# Patient Record
Sex: Female | Born: 1955
Health system: Southern US, Community
[De-identification: ages and names within clinical notes are randomized; demographics above are authoritative.]

## PROBLEM LIST (undated history)

## (undated) DIAGNOSIS — N189 Chronic kidney disease, unspecified: Secondary | ICD-10-CM

## (undated) DIAGNOSIS — R55 Syncope and collapse: Principal | ICD-10-CM

## (undated) DIAGNOSIS — E669 Obesity, unspecified: Secondary | ICD-10-CM

## (undated) DIAGNOSIS — M1990 Unspecified osteoarthritis, unspecified site: Secondary | ICD-10-CM

## (undated) DIAGNOSIS — I10 Essential (primary) hypertension: Secondary | ICD-10-CM

## (undated) HISTORY — PX: CATARACT EXTRACTION: SUR2

## (undated) HISTORY — PX: OTHER SURGICAL HISTORY: SHX169

## (undated) HISTORY — DX: Chronic kidney disease, unspecified: N18.9

## (undated) HISTORY — DX: Syncope and collapse: R55

## (undated) HISTORY — DX: Obesity, unspecified: E66.9

## (undated) HISTORY — PX: BACK SURGERY: SHX140

---

## 1995-12-17 HISTORY — PX: TUBAL LIGATION: SHX77

## 1995-12-17 HISTORY — PX: EXPLORATORY LAPAROTOMY: SUR591

## 2002-12-16 HISTORY — PX: BREAST BIOPSY: SHX20

## 2002-12-16 HISTORY — PX: SEPTOPLASTY: SHX2393

## 2003-03-01 ENCOUNTER — Emergency Department (HOSPITAL_COMMUNITY): Admission: EM | Admit: 2003-03-01 | Discharge: 2003-03-02 | Payer: Self-pay | Admitting: Emergency Medicine

## 2003-03-02 ENCOUNTER — Encounter: Payer: Self-pay | Admitting: Emergency Medicine

## 2003-03-30 ENCOUNTER — Ambulatory Visit (HOSPITAL_COMMUNITY): Admission: RE | Admit: 2003-03-30 | Discharge: 2003-03-30 | Payer: Self-pay | Admitting: Otolaryngology

## 2006-12-31 ENCOUNTER — Ambulatory Visit (HOSPITAL_COMMUNITY): Admission: RE | Admit: 2006-12-31 | Discharge: 2006-12-31 | Payer: Self-pay | Admitting: Family Medicine

## 2007-07-08 ENCOUNTER — Ambulatory Visit (HOSPITAL_COMMUNITY): Admission: RE | Admit: 2007-07-08 | Discharge: 2007-07-08 | Payer: Self-pay | Admitting: Obstetrics and Gynecology

## 2008-01-20 ENCOUNTER — Ambulatory Visit (HOSPITAL_COMMUNITY): Admission: RE | Admit: 2008-01-20 | Discharge: 2008-01-20 | Payer: Self-pay | Admitting: Family Medicine

## 2009-02-01 ENCOUNTER — Ambulatory Visit (HOSPITAL_COMMUNITY): Admission: RE | Admit: 2009-02-01 | Discharge: 2009-02-01 | Payer: Self-pay | Admitting: Family Medicine

## 2010-03-19 ENCOUNTER — Ambulatory Visit (HOSPITAL_COMMUNITY): Admission: RE | Admit: 2010-03-19 | Discharge: 2010-03-19 | Payer: Self-pay | Admitting: Family Medicine

## 2011-01-06 ENCOUNTER — Encounter: Payer: Self-pay | Admitting: Family Medicine

## 2011-05-03 ENCOUNTER — Other Ambulatory Visit (HOSPITAL_COMMUNITY): Payer: Self-pay | Admitting: Family Medicine

## 2011-05-03 DIAGNOSIS — Z139 Encounter for screening, unspecified: Secondary | ICD-10-CM

## 2011-05-03 NOTE — Discharge Summary (Signed)
Cindy Mcknight, Cindy Mcknight                              ACCOUNT NO.:  [id number]   MEDICAL RECORD NO.:  [id number]                   PATIENT TYPE:  INP   LOCATION:  1823                                 FACILITY:  [facility]   PHYSICIAN:  Sherin Quarry, MD                   DATE OF BIRTH:  1956-07-09   DATE OF ADMISSION:  03/01/2003  DATE OF DISCHARGE:  03/02/2003                                 DISCHARGE SUMMARY   HISTORY OF PRESENT ILLNESS:  The patient is a 55 year old lady whose only  chronic medication is Wellbutrin 300 mg daily.  She has a previous history  of syncope, most recently about five years ago.  The patient indicates that  she was having dinner with some friends at [facility] when she went  to the bathroom.  In the bathroom she urinated and then felt somewhat dizzy.  She stood up and applied a cold compress to her forehead, and then the next  thing she remembers is that she felt as though she was awakening and noticed  that she had some bleeding from her nose.  On reflection, she concluded that  she must have passed out, hit her nose, and caused a laceration.  She  estimates that she must have been unconsciousness for two or three minutes.  There was no associated headache, visual blurring, breathing difficulty, or  chest pain.   PHYSICAL EXAMINATION:  GENERAL:  Physical exam at the time of admission is  described by Dr. Leander Rams.  VITAL SIGNS:  Her blood pressure is 114/65, pulse 94, respirations 20,  temperature was 97.6.  HEENT:  Within normal limits.  CHEST:  Clear.  CARDIOVASCULAR:  Revealed a grade 2-3/6 systolic murmur which could be heard  both in the aortic and the mitral area.  ABDOMEN:  Benign.  NEUROLOGIC:  Normal.  EXTREMITIES:  Normal.   LABORATORY DATA:  EKG showed no ischemic changes.   A CT scan of the brain as well as a CT scan of the spine was negative.  The  patient's x-rays showed a deviation of the nasal septum.   HOSPITAL COURSE:  The laceration  of the nose was sewn up.  The patient was  observed overnight.  Telemetry showed no abnormalities.  CBC and BMET were  within normal limits.  By March 02, 2003, at 10:30, the patient felt well.  She had no dizziness.  She had no headache, no breathing difficulty, or  chest pain.  At that time it was felt reasonable to discharge the patient.  The assumption was that the patient had experienced an episode of vasovagal  syncope.   DISCHARGE DIAGNOSES:  1. Syncope secondary to vasovagal reaction.  2. Nasal septum fracture.  3. Laceration of the forehead.  4. Depression.   DISCHARGE INSTRUCTIONS:  The patient was advised to continue Wellbutrin 300  mg daily.  I  also advised her that in light of her heart murmur, it was  important for her to have an echocardiogram.  I offered to keep her in the  hospital until an echocardiogram could be obtained but, understandably, she  did not want to do that.  I offered to  schedule an echocardiogram at [facility], but she would prefer that  this be done by her primary doctor, Dr. Neita Carp, at [facility].  Therefore, I will suggest this in the discharge summary.  The patient will  follow up with Dr. Neita Carp in 10-14 days.                                               Sherin Quarry, MD    SY/MEDQ  D:  03/02/2003  T:  03/03/2003  Job:  [id number]   cc:   Fara Chute  [address] [place]  [place] [address]  Fax: [phone]

## 2011-05-03 NOTE — Op Note (Signed)
Cindy Mcknight, Cindy Mcknight                              ACCOUNT NO.:  [id number]   MEDICAL RECORD NO.:  [id number]                   PATIENT TYPE:  OIB   LOCATION:  NA                                   FACILITY:  [facility]   PHYSICIAN:  Kinnie Scales. Annalee Genta, M.D.            DATE OF BIRTH:  1956/09/09   DATE OF PROCEDURE:  03/30/2003  DATE OF DISCHARGE:                                 OPERATIVE REPORT   PREOPERATIVE DIAGNOSES:  1. Open nasal fracture.  2. Traumatic nasal septal deviation.  3. History of facial trauma.   POSTOPERATIVE DIAGNOSES:  1. Open nasal fracture.  2. Traumatic nasal septal deviation.  3. History of facial trauma.   INDICATION FOR PROCEDURE:  1. Open nasal fracture.  2. Traumatic nasal septal deviation.  3. History of facial trauma.   PROCEDURES:  1. Nasal septal reconstruction.  2. Closed reduction, nasal fracture.  3. Inferior turbinate cautery.   ANESTHESIA:  General endotracheal.   SURGEON:  Kinnie Scales. Annalee Genta, M.D.   COMPLICATIONS:  None.   ESTIMATED BLOOD LOSS:  Approximately 50 mL..   DISPOSITION:  Patient transferred from the operating room to the recovery  room in stable condition.   BRIEF HISTORY:  The patient is a 55 year old white female with a history of  depression and borderline hypoglycemia, who fell in late March with an acute  syncopal episode.  She struck her face and had a significant open nasal  fracture with severely comminuted nasal septal fracture.  She was seen in  the emergency department and her external nasal laceration was closed.  CT  scanning was obtained.  Workup included head CT, which showed no evidence of  intracranial abnormality.  The patient was seen as an outpatient on 03/08/03.  She was found to have a severely deviated nasal septum and open nasal  fracture.  I recommended that we undertake open reduction of nasal fracture  and nasal septal reconstruction.  The patient was scheduled for initial  surgery as an  outpatient at the [facility] day surgical center.  At the time  of her surgical procedure the patient was stable, but upon transport to the  operating room on 03/24/03 the patient had a second syncopal episode.  She was  referred to Jonelle Sidle, M.D., of [facility] Cardiology service for  evaluation and workup, and it was felt that this represented neurogenic or  vasovagal syncope.  There was no evidence of cardiac abnormality or  hypoglycemia, and the patient was cleared for general anesthesia for her  surgical procedure.  Informed consent was obtained from the patient and her  family prior to surgical intervention for open reduction of nasal fracture,  nasal septal reconstruction, and turbinate cautery.  The risks, benefits,  and possible complications of each of these surgical procedures were  discussed in detail with the patient and her family.  They understood and  concurred  with our plan for surgery, which was scheduled at [facility] main OR with possible overnight observation.   DESCRIPTION OF PROCEDURE:  The patient was brought into the operating room  on 03/30/03, placed in the supine position on the operating table.  General  endotracheal anesthesia was established without difficulty.  With the  patient adequately anesthetized, she was injected with 12 mL of 1% lidocaine  and 1:100,000 dilute epinephrine injected in a submucosal fashion along the  nasal septum and inferior turbinates as well as a regional block along the  external nasal bones.  The patient's nose was then packed with Afrin-soaked  cottonoid pledgets that were left in place for approximately 10 minutes to  allow for vasoconstriction and hemostasis.  The patient was prepped and  draped in a sterile fashion and pledgets were removed.  Intranasal  examination showed a severely deviated nasal septum with septal spurring,  findings consistent with acute nasal septal trauma.  There was no evidence  of septal  perforation or septal hematoma.  The surgical procedure was begun  by creating a right hemitransfixion incision.  It was carried through the  mucosa and the underlying submucosa and a mucoperiosteal flap was elevated  from anterior to posterior along the patient's right nasal septum.  There  was a severely deviated nasal septum with an acute septal cartilage fracture  consistent with her traumatic history.  The patient had a large septal spur  occluding the left nasal cavity.  The bony-cartilaginous junction was  crossed in the midline.  A mucoperichondrial flap was elevated along the  patient's left-hand side and deviated bone and cartilage from the mid- and  posterior aspects of the nasal septum were resected.  Anterior dorsal and  columellar cartilage was intact.  Fractured cartilage was removed.  Cartilage was morcellized and returned to the mucoperichondrial pocket at  the completion of the surgical procedure.  The mucoperichondrial flaps were  reapproximated with a 4-0 gut suture on a Keith needle in a horizontal  mattressing fashion.  The hemitransfixion incision was closed with the same  stitch.  At the conclusion of the procedure bilateral Doyle nasal septal  splints were placed after the application of Bactroban ointment and sutured  with a 3-0 Ethilon suture.   Bilateral inferior turbinate intramural cautery was then performed.  With  the cautery set at 12 watts, two passes were made in submucosal fashion in  each inferior turbinate and when the turbinates were adequately cauterized,  they were outfractured to create a more patent nasal cavity.   Attention was then turned to the patient's nasal dorsum.  There was  significant widening of the nasal dorsum with depression of the nasal bones  bilaterally.  A closed reduction of nasal fracture was then undertaken using  a Goldman Psychologist, counselling.  The patient's nasal bones were elevated, brought into a good midline position with  excellent anterior nasal projection.  This  appeared to be stable, and there was no significant nasal dorsal deviation.  The patient did have an improvement in nasal dorsal widening, continued to  have an external nasal laceration.  An external rhinoplasty splint was then  applied after application of Benzoin solution, quarter-inch Steri-Strips  were placed over the nasal dorsum, and an Aquaplast nasal splint was then  applied and conformed to the nasal profile, and half-inch paper tap was  applied.  The patient was then awakened from her anesthetic, the oral cavity  and oropharynx were irrigated and suctioned.  Orogastric tube was passed,  stomach contents were aspirated.  The patient was then awakened, extubated,  and transferred from the operating room to the recovery room in stable  condition.   COMPLICATIONS:  None.   ESTIMATED BLOOD LOSS:  Approximately 50 mL.                                              Kinnie Scales. Annalee Genta, M.D.   DLS/MEDQ  D:  45/40/9811  T:  03/30/2003  Job:  [id number]

## 2011-05-07 ENCOUNTER — Ambulatory Visit (HOSPITAL_COMMUNITY)
Admission: RE | Admit: 2011-05-07 | Discharge: 2011-05-07 | Disposition: A | Discharge status: Home / Self Care | Payer: BC Managed Care – PPO | Source: Ambulatory Visit | Attending: Family Medicine | Admitting: Family Medicine

## 2011-05-07 DIAGNOSIS — Z139 Encounter for screening, unspecified: Secondary | ICD-10-CM

## 2011-05-07 DIAGNOSIS — Z1231 Encounter for screening mammogram for malignant neoplasm of breast: Secondary | ICD-10-CM | POA: Insufficient documentation

## 2012-05-15 ENCOUNTER — Telehealth: Payer: Self-pay

## 2012-05-15 ENCOUNTER — Other Ambulatory Visit: Payer: Self-pay

## 2012-05-15 DIAGNOSIS — Z139 Encounter for screening, unspecified: Secondary | ICD-10-CM

## 2012-05-15 NOTE — Telephone Encounter (Signed)
Gastroenterology Pre-Procedure Form   Pt said she had a flex sig many years ago by Dr. Jena Gauss  Pt is diabetic/ I did not have an early appt for this week and this was the only week she would do it  Request Date: 05/15/2012     Requesting Physician: Dr. Neita Carp     PATIENT INFORMATION:  Cindy Mcknight is a 56 y.o., female (DOB=1956-07-21).  PROCEDURE: Procedure(s) requested: colonoscopy Procedure Reason: screening for colon cancer  PATIENT REVIEW QUESTIONS: The patient reports the following:   1. Diabetes Melitis: yes  2. Joint replacements in the past 12 months: no 3. Major health problems in the past 3 months: no 4. Has an artificial valve or MVP:no 5. Has been advised in past to take antibiotics in advance of a procedure like teeth cleaning: no}    MEDICATIONS & ALLERGIES:    Patient reports the following regarding taking any blood thinners:   Plavix? no Aspirin?yes  Coumadin?  no  Patient confirms/reports the following medications:  Current Outpatient Prescriptions  Medication Sig Dispense Refill  . aspirin 81 MG tablet Take 81 mg by mouth daily.      Marland Kitchen lisinopril (PRINIVIL,ZESTRIL) 20 MG tablet Take 20 mg by mouth daily.      . metFORMIN (GLUCOPHAGE) 500 MG tablet Take 500 mg by mouth once. Pt takes four pills daily in the evening      . naproxen sodium (ANAPROX) 220 MG tablet Take 220 mg by mouth 2 (two) times daily with a meal. Only as needed      . NON FORMULARY Osteo Biflex   Takes two tabs daily        Patient confirms/reports the following allergies:  No Known Allergies  Patient is appropriate to schedule for requested procedure(s): yes  AUTHORIZATION INFORMATION Primary Insurance:   ID #:   Group #:  Pre-Cert / Auth required:  Pre-Cert / Auth #:   Secondary Insurance:  ID #:   Group #:  Pre-Cert / Auth required:  Pre-Cert / Auth #:   No orders of the defined types were placed in this encounter.    SCHEDULE INFORMATION: Procedure has been scheduled as  follows:  Date: 06/08/2012         Time: 10:45 AM  Location: [facility] Short Stay  This Gastroenterology Pre-Precedure Form is being routed to the following provider(s) for review: R. Roetta Sessions, MD

## 2012-05-18 NOTE — Telephone Encounter (Signed)
Day of prep, hold metformin. If sugar high, can take 1/2 dose. May take sip of OJ in AM of procedure if sugar low.

## 2012-05-19 NOTE — Telephone Encounter (Signed)
Rx and instructions mailed to pt.  

## 2012-06-01 ENCOUNTER — Encounter (HOSPITAL_COMMUNITY): Payer: Self-pay | Admitting: Pharmacy Technician

## 2012-06-02 ENCOUNTER — Telehealth: Payer: Self-pay

## 2012-06-02 NOTE — Telephone Encounter (Signed)
Pt wanted to know if she could do a different prep.Please advise and call her at [phone].

## 2012-06-02 NOTE — Telephone Encounter (Signed)
Can give Moviprep.

## 2012-06-02 NOTE — Telephone Encounter (Signed)
Routing to Leslie Lewis, PA.  

## 2012-06-03 NOTE — Telephone Encounter (Signed)
Per Tana Coast, PA , OK to give the Miralax prep if pt has not had any issues with constipation. Pt said she has not had any at all. Instructions in mail. If pt does not receive, she will come by before we close on Fri noon to pick up copy here.

## 2012-06-05 MED ORDER — SODIUM CHLORIDE 0.45 % IV SOLN
Freq: Once | INTRAVENOUS | Status: AC
  Administered 2012-06-08: 11:00:00 via INTRAVENOUS

## 2012-06-08 ENCOUNTER — Encounter (HOSPITAL_COMMUNITY)
Admission: RE | Disposition: A | Discharge status: Home Health | Payer: Self-pay | Source: Ambulatory Visit | Attending: Internal Medicine

## 2012-06-08 ENCOUNTER — Encounter (HOSPITAL_COMMUNITY): Payer: Self-pay

## 2012-06-08 ENCOUNTER — Ambulatory Visit (HOSPITAL_COMMUNITY)
Admission: RE | Admit: 2012-06-08 | Discharge: 2012-06-08 | Disposition: A | Discharge status: Home Health | Payer: BC Managed Care – PPO | Source: Ambulatory Visit | Attending: Internal Medicine | Admitting: Internal Medicine

## 2012-06-08 DIAGNOSIS — Z1211 Encounter for screening for malignant neoplasm of colon: Secondary | ICD-10-CM

## 2012-06-08 DIAGNOSIS — E119 Type 2 diabetes mellitus without complications: Secondary | ICD-10-CM | POA: Insufficient documentation

## 2012-06-08 DIAGNOSIS — Z139 Encounter for screening, unspecified: Secondary | ICD-10-CM

## 2012-06-08 HISTORY — PX: COLONOSCOPY: SHX5424

## 2012-06-08 HISTORY — DX: Syncope and collapse: R55

## 2012-06-08 SURGERY — COLONOSCOPY
Anesthesia: Moderate Sedation

## 2012-06-08 MED ORDER — MIDAZOLAM HCL 5 MG/5ML IJ SOLN
INTRAMUSCULAR | Status: AC
  Filled 2012-06-08: qty 10

## 2012-06-08 MED ORDER — MEPERIDINE HCL 100 MG/ML IJ SOLN
INTRAMUSCULAR | Status: DC | PRN
  Administered 2012-06-08 (×2): 50 mg via INTRAVENOUS

## 2012-06-08 MED ORDER — STERILE WATER FOR IRRIGATION IR SOLN
Status: DC | PRN
  Administered 2012-06-08: 13:00:00

## 2012-06-08 MED ORDER — MEPERIDINE HCL 100 MG/ML IJ SOLN
INTRAMUSCULAR | Status: AC
  Filled 2012-06-08: qty 2

## 2012-06-08 MED ORDER — MIDAZOLAM HCL 5 MG/5ML IJ SOLN
INTRAMUSCULAR | Status: DC | PRN
  Administered 2012-06-08: 1 mg via INTRAVENOUS
  Administered 2012-06-08 (×2): 2 mg via INTRAVENOUS
  Administered 2012-06-08: 1 mg via INTRAVENOUS

## 2012-06-08 NOTE — Discharge Instructions (Signed)
Colonoscopy Care After Read the instructions outlined below and refer to this sheet in the next few weeks. These discharge instructions provide you with general information on caring for yourself after you leave the hospital. Your doctor may also give you specific instructions. While your treatment has been planned according to the most current medical practices available, unavoidable complications occasionally occur. If you have any problems or questions after discharge, call your doctor. HOME CARE INSTRUCTIONS ACTIVITY:  You may resume your regular activity, but move at a slower pace for the next 24 hours.   Take frequent rest periods for the next 24 hours.   Walking will help get rid of the air and reduce the bloated feeling in your belly (abdomen).   No driving for 24 hours (because of the medicine (anesthesia) used during the test).   You may shower.   Do not sign any important legal documents or operate any machinery for 24 hours (because of the anesthesia used during the test).  NUTRITION:  Drink plenty of fluids.   You may resume your normal diet as instructed by your doctor.   Begin with a light meal and progress to your normal diet. Heavy or fried foods are harder to digest and may make you feel sick to your stomach (nauseated).   Avoid alcoholic beverages for 24 hours or as instructed.  MEDICATIONS:  You may resume your normal medications unless your doctor tells you otherwise.  WHAT TO EXPECT TODAY:  Some feelings of bloating in the abdomen.   Passage of more gas than usual.   Spotting of blood in your stool or on the toilet paper.  IF YOU HAD POLYPS REMOVED DURING THE COLONOSCOPY:  No aspirin products for 7 days or as instructed.   No alcohol for 7 days or as instructed.   Eat a soft diet for the next 24 hours.  FINDING OUT THE RESULTS OF YOUR TEST Not all test results are available during your visit. If your test results are not back during the visit, make an  appointment with your caregiver to find out the results. Do not assume everything is normal if you have not heard from your caregiver or the medical facility. It is important for you to follow up on all of your test results.  SEEK IMMEDIATE MEDICAL CARE IF:  You have more than a spotting of blood in your stool.   Your belly is swollen (abdominal distention).   You are nauseated or vomiting.   You have a fever.   You have abdominal pain or discomfort that is severe or gets worse throughout the day.  Document Released: 07/16/2004 Document Revised: 11/21/2011 Document Reviewed: 07/14/2008 ExitCare Patient Information 2012 [employer]. 

## 2012-06-08 NOTE — Op Note (Signed)
[facility] [address] [place], [place]  [address]  COLONOSCOPY PROCEDURE REPORT  PATIENT:  Cindy, Mcknight  MR#:  [id number] BIRTHDATE:  1956-06-05, 56 yrs. old  GENDER:  female ENDOSCOPIST:  R. Roetta Sessions, MD FACP [facility] REF. BY:  Fara Chute, M.D.  Dr Forrest Moron PROCEDURE DATE:  06/08/2012 PROCEDURE:  Screening colonoscopy  INDICATIONS:  First-ever average risk screening colonoscopy  INFORMED CONSENT:  The risks, benefits, alternatives and imponderables including but not limited to bleeding, perforation as well as the possibility of a missed lesion have been reviewed. The potential for biopsy, lesion removal, etc. have also been discussed.  Questions have been answered.  All parties agreeable. Please see the history and physical in the medical record for more information.  MEDICATIONS:  Versed 6 mg IV and Demerol 100 mg IV in divided doses.  DESCRIPTION OF PROCEDURE:  After a digital rectal exam was performed, the EC-[id number] ([id number]) colonoscope was advanced from the anus through the rectum and colon to the area of the cecum, ileocecal valve and appendiceal orifice.  The cecum was deeply intubated.  These structures were well-seen and photographed for the record.  From the level of the cecum and ileocecal valve, the scope was slowly and cautiously withdrawn.  The mucosal surfaces were carefully surveyed utilizing scope tip deflection to facilitate fold flattening as needed.  The scope was pulled down into the rectum where a thorough examination including retroflexion was performed. <<PROCEDUREIMAGES>>  FINDINGS: Adequate preparation. Anal papilla; otherwise normal rectum. Normal appearing colonic mucosa.  THERAPEUTIC / DIAGNOSTIC MANEUVERS PERFORMED: None  COMPLICATIONS:  None  CECAL WITHDRAWAL TIME: 11 minutes  IMPRESSION:  Normal colonoscopy  RECOMMENDATIONS: Repeat screening colonoscopy in 10 years  ______________________________ R. Roetta Sessions, MD Caleen Essex  CC:  Fara Chute, M.D.  n. eSIGNED:   R. Roetta Sessions at 06/08/2012 01:03 PM  Cherrie Gauze, [phone]

## 2012-06-08 NOTE — H&P (Signed)
  Primary Care Physician:  Estanislado Pandy, MD Primary Gastroenterologist:  Dr. Jena Gauss  Pre-Procedure History & Physical: HPI:  ZAMIRA HICKAM is a 56 y.o. female is here for a screening colonoscopy. Flexible sigmoidoscopy reportedly back in 1997. No bowel symptoms currently. No family history colon polyps or colon cancer. No prior colonoscopy  Past Medical History  Diagnosis Date  . Diabetes mellitus   . Fainting     on occasion when she see needles    Past Surgical History  Procedure Date  . Cesect   . Septoplasty 2004  . Cesarean section 1988  . Exploratory laparotomy 1997    Prior to Admission medications   Medication Sig Start Date End Date Taking? Authorizing Provider  aspirin EC 81 MG tablet Take 81 mg by mouth daily.   Yes Historical Provider, MD  Glucosamine-Chondroit-Vit C-Mn (GLUCOSAMINE 1500 COMPLEX PO) Take 1 tablet by mouth 2 (two) times daily.   Yes Historical Provider, MD  lisinopril (PRINIVIL,ZESTRIL) 20 MG tablet Take 20 mg by mouth daily.   Yes Historical Provider, MD  metFORMIN (GLUCOPHAGE-XR) 500 MG 24 hr tablet Take 2,000 mg by mouth daily with breakfast.   Yes Historical Provider, MD    Allergies as of 05/15/2012  . (No Known Allergies)    History reviewed. No pertinent family history.  History   Social History  . Marital Status: Divorced    Spouse Name: N/A    Number of Children: N/A  . Years of Education: N/A   Occupational History  . Not on file.   Social History Main Topics  . Smoking status: Never Smoker   . Smokeless tobacco: Not on file  . Alcohol Use: No  . Drug Use: No  . Sexually Active:    Other Topics Concern  . Not on file   Social History Narrative  . No narrative on file    Review of Systems: See HPI, otherwise negative ROS  Physical Exam: BP 117/71  Pulse 82  Temp 98.1 F (36.7 C) (Oral)  Resp 22  Ht 5\' 7"  (1.702 m)  Wt 270 lb (122.471 kg)  BMI 42.29 kg/m2  SpO2 98% General:   Alert,  Well-developed,  well-nourished, pleasant and cooperative in NAD Head:  Normocephalic and atraumatic. Eyes:  Sclera clear, no icterus.   Conjunctiva pink. Ears:  Normal auditory acuity. Nose:  No deformity, discharge,  or lesions. Mouth:  No deformity or lesions, dentition normal. Neck:  Supple; no masses or thyromegaly. Lungs:  Clear throughout to auscultation.   No wheezes, crackles, or rhonchi. No acute distress. Heart:  Regular rate and rhythm; no murmurs, clicks, rubs,  or gallops. Abdomen:  Soft, nontender and nondistended. No masses, hepatosplenomegaly or hernias noted. Normal bowel sounds, without guarding, and without rebound.   Msk:  Symmetrical without gross deformities. Normal posture. Pulses:  Normal pulses noted. Extremities:  Without clubbing or edema. Neurologic:  Alert and  oriented x4;  grossly normal neurologically. Skin:  Intact without significant lesions or rashes. Cervical Nodes:  No significant cervical adenopathy. Psych:  Alert and cooperative. Normal mood and affect.  Impression/Plan: HILMA STEINHILBER is now here to undergo a screening colonoscopy.  First-ever average risk screening colonoscopy.  Risks, benefits, limitations, imponderables and alternatives regarding colonoscopy have been reviewed with the patient. Questions have been answered. All parties agreeable.

## 2012-06-10 ENCOUNTER — Encounter (HOSPITAL_COMMUNITY): Payer: Self-pay | Admitting: Internal Medicine

## 2012-09-18 ENCOUNTER — Other Ambulatory Visit (HOSPITAL_COMMUNITY): Payer: Self-pay | Admitting: Family Medicine

## 2012-09-18 DIAGNOSIS — Z139 Encounter for screening, unspecified: Secondary | ICD-10-CM

## 2012-09-22 ENCOUNTER — Ambulatory Visit (HOSPITAL_COMMUNITY)
Admission: RE | Admit: 2012-09-22 | Discharge: 2012-09-22 | Disposition: A | Discharge status: Home / Self Care | Payer: BC Managed Care – PPO | Source: Ambulatory Visit | Attending: Family Medicine | Admitting: Family Medicine

## 2012-09-22 DIAGNOSIS — Z139 Encounter for screening, unspecified: Secondary | ICD-10-CM

## 2012-09-22 DIAGNOSIS — Z1231 Encounter for screening mammogram for malignant neoplasm of breast: Secondary | ICD-10-CM | POA: Insufficient documentation

## 2013-10-04 ENCOUNTER — Other Ambulatory Visit (HOSPITAL_COMMUNITY): Payer: Self-pay | Admitting: Obstetrics and Gynecology

## 2013-10-04 DIAGNOSIS — Z139 Encounter for screening, unspecified: Secondary | ICD-10-CM

## 2013-10-05 ENCOUNTER — Ambulatory Visit (HOSPITAL_COMMUNITY)
Admission: RE | Admit: 2013-10-05 | Discharge: 2013-10-05 | Disposition: A | Discharge status: Home / Self Care | Payer: BC Managed Care – PPO | Source: Ambulatory Visit | Attending: Obstetrics and Gynecology | Admitting: Obstetrics and Gynecology

## 2013-10-05 DIAGNOSIS — Z1231 Encounter for screening mammogram for malignant neoplasm of breast: Secondary | ICD-10-CM | POA: Insufficient documentation

## 2013-10-05 DIAGNOSIS — Z139 Encounter for screening, unspecified: Secondary | ICD-10-CM

## 2013-10-07 ENCOUNTER — Ambulatory Visit (HOSPITAL_COMMUNITY): Payer: BC Managed Care – PPO

## 2014-10-07 ENCOUNTER — Other Ambulatory Visit (HOSPITAL_COMMUNITY): Payer: Self-pay | Admitting: Family Medicine

## 2014-10-07 DIAGNOSIS — Z1231 Encounter for screening mammogram for malignant neoplasm of breast: Secondary | ICD-10-CM

## 2014-10-13 ENCOUNTER — Ambulatory Visit (HOSPITAL_COMMUNITY)
Admission: RE | Admit: 2014-10-13 | Discharge: 2014-10-13 | Disposition: A | Discharge status: Home / Self Care | Payer: BC Managed Care – PPO | Source: Ambulatory Visit | Attending: Family Medicine | Admitting: Family Medicine

## 2014-10-13 DIAGNOSIS — Z1231 Encounter for screening mammogram for malignant neoplasm of breast: Secondary | ICD-10-CM | POA: Insufficient documentation

## 2015-08-11 ENCOUNTER — Ambulatory Visit: Payer: BC Managed Care – PPO | Admitting: Nutrition

## 2015-12-22 ENCOUNTER — Encounter: Payer: Self-pay | Admitting: Neurology

## 2015-12-22 ENCOUNTER — Ambulatory Visit (INDEPENDENT_AMBULATORY_CARE_PROVIDER_SITE_OTHER): Payer: BC Managed Care – PPO | Admitting: Neurology

## 2015-12-22 VITALS — BP 132/79 | HR 93 | Ht 67.0 in | Wt 240.0 lb

## 2015-12-22 DIAGNOSIS — R55 Syncope and collapse: Secondary | ICD-10-CM | POA: Diagnosis not present

## 2015-12-22 HISTORY — DX: Syncope and collapse: R55

## 2015-12-22 NOTE — Patient Instructions (Addendum)
We will set you up for testing to include MRI of the brain and EEG evaluation. We will get a 30 day cardiac monitor.  Syncope Syncope is a medical term for fainting or passing out. This means you lose consciousness and drop to the ground. People are generally unconscious for less than 5 minutes. You may have some muscle twitches for up to 15 seconds before waking up and returning to normal. Syncope occurs more often in older adults, but it can happen to anyone. While most causes of syncope are not dangerous, syncope can be a sign of a serious medical problem. It is important to seek medical care.  CAUSES  Syncope is caused by a sudden drop in blood flow to the brain. The specific cause is often not determined. Factors that can bring on syncope include:  Taking medicines that lower blood pressure.  Sudden changes in posture, such as standing up quickly.  Taking more medicine than prescribed.  Standing in one place for too long.  Seizure disorders.  Dehydration and excessive exposure to heat.  Low blood sugar (hypoglycemia).  Straining to have a bowel movement.  Heart disease, irregular heartbeat, or other circulatory problems.  Fear, emotional distress, seeing blood, or severe pain. SYMPTOMS  Right before fainting, you may:  Feel dizzy or light-headed.  Feel nauseous.  See all white or all black in your field of vision.  Have cold, clammy skin. DIAGNOSIS  Your health care provider will ask about your symptoms, perform a physical exam, and perform an electrocardiogram (ECG) to record the electrical activity of your heart. Your health care provider may also perform other heart or blood tests to determine the cause of your syncope which may include:  Transthoracic echocardiogram (TTE). During echocardiography, sound waves are used to evaluate how blood flows through your heart.  Transesophageal echocardiogram (TEE).  Cardiac monitoring. This allows your health care provider  to monitor your heart rate and rhythm in real time.  Holter monitor. This is a portable device that records your heartbeat and can help diagnose heart arrhythmias. It allows your health care provider to track your heart activity for several days, if needed.  Stress tests by exercise or by giving medicine that makes the heart beat faster. TREATMENT  In most cases, no treatment is needed. Depending on the cause of your syncope, your health care provider may recommend changing or stopping some of your medicines. HOME CARE INSTRUCTIONS  Have someone stay with you until you feel stable.  Do not drive, use machinery, or play sports until your health care provider says it is okay.  Keep all follow-up appointments as directed by your health care provider.  Lie down right away if you start feeling like you might faint. Breathe deeply and steadily. Wait until all the symptoms have passed.  Drink enough fluids to keep your urine clear or pale yellow.  If you are taking blood pressure or heart medicine, get up slowly and take several minutes to sit and then stand. This can reduce dizziness. SEEK IMMEDIATE MEDICAL CARE IF:   You have a severe headache.  You have unusual pain in the chest, abdomen, or back.  You are bleeding from your mouth or rectum, or you have black or tarry stool.  You have an irregular or very fast heartbeat.  You have pain with breathing.  You have repeated fainting or seizure-like jerking during an episode.  You faint when sitting or lying down.  You have confusion.  You have  trouble walking.  You have severe weakness.  You have vision problems. If you fainted, call your local emergency services (911 in [place] Do not drive yourself to the hospital.    This information is not intended to replace advice given to you by your health care provider. Make sure you discuss any questions you have with your health care provider.   Document Released: 12/02/2005 Document  Revised: 04/18/2015 Document Reviewed: 01/31/2012 Elsevier Interactive Patient Education [employer]2016 Elsevier Inc.

## 2015-12-22 NOTE — Progress Notes (Signed)
Reason for visit:  syncope  Referring physician:  [name] is a 60 y.o. female  History of present illness:   Cindy Mcknight is a 60 year old left-handed white female with a history of a recent syncopal event that occurred on the 31st of November 2016. The patient was in a restaurant at the time, she felt " strange " for several minutes. She indicated that she wanted to go outside to get fresh air, but she passed out at the table. She had loss of conscious for about 1 or 2 minutes, she vomited during this event and she had some urinary incontinence, but no bowel incontinence. The patient denied any focal numbness or weakness of the face, arms, or legs. She denied any difficulty with palpitations of the heart or chest pain. She indicates that she has had multiple episodes of syncope throughout her life, she had one event in 2004 where she fell and fractured her nose, she had a cardiac workup around that time that apparently was unremarkable.  She indicates that when she gets too hot she may black out. She has multiple family members including her mother and at least 2 brothers who have had syncopal events as well. The patient denies feeling clammy around the time of the event, she was not told that her face was blanched with the event. She does not recall being nauseated before she blacked out. She is sent to this office for an evaluation. An EKG was unremarkable. EMS was called following the blackout, a CBG was about 147. She reports no history of exercise induced syncope.  Past Medical History  Diagnosis Date  . Diabetes mellitus   . Fainting     on occasion when she see needles  . Syncope and collapse 12/22/2015  . Obesity   . Chronic renal insufficiency     Past Surgical History  Procedure Laterality Date  . Cesect    . Septoplasty  2004  . Cesarean section  1988  . Exploratory laparotomy  1997  . Colonoscopy  06/08/2012    Procedure: COLONOSCOPY;  Surgeon: Corbin Ade, MD;  Location: [facility];  Service: Endoscopy;  Laterality: N/A;  10:45 AM  . Cataract extraction Bilateral     Family History  Problem Relation Age of Onset  . Diabetes Mother   . Syncope episode Mother   . Cancer Father     prostate  . Syncope episode Brother   . Syncope episode Brother     Social history:  reports that she has never smoked. She has never used smokeless tobacco. She reports that she does not drink alcohol or use illicit drugs.  Medications:  Prior to Admission medications   Medication Sig Start Date End Date Taking? Authorizing Provider  aspirin EC 81 MG tablet Take 81 mg by mouth daily.    Historical Provider, MD  Glucosamine-Chondroit-Vit C-Mn (GLUCOSAMINE 1500 COMPLEX PO) Take 1 tablet by mouth 2 (two) times daily.    Historical Provider, MD  lisinopril (PRINIVIL,ZESTRIL) 20 MG tablet Take 20 mg by mouth daily.    Historical Provider, MD  metFORMIN (GLUCOPHAGE-XR) 500 MG 24 hr tablet Take 2,000 mg by mouth daily with breakfast.    Historical Provider, MD     No Known Allergies  ROS:  Out of a complete 14 system review of symptoms, the patient complains only of the following symptoms, and all other reviewed systems are negative.   Syncope  Blood pressure 132/79, pulse  93, height 5\' 7"  (1.702 m), weight 240 lb (108.863 kg).  Physical Exam  General: The patient is alert and cooperative at the time of the examination. The patient is moderately obese.  Eyes: Pupils are equal, round, and reactive to light. Discs are flat bilaterally.  Neck: The neck is supple, no carotid bruits are noted.  Respiratory: The respiratory examination is clear.  Cardiovascular: The cardiovascular examination reveals a regular rate and rhythm, no obvious murmurs or rubs are noted.  Skin: Extremities are without significant edema.  Neurologic Exam  Mental status: The patient is alert and oriented x 3 at the time of the examination. The patient has apparent normal  recent and remote memory, with an apparently normal attention span and concentration ability.  Cranial nerves: Facial symmetry is present. There is good sensation of the face to pinprick and soft touch bilaterally. The strength of the facial muscles and the muscles to head turning and shoulder shrug are normal bilaterally. Speech is well enunciated, no aphasia or dysarthria is noted. Extraocular movements are full. Visual fields are full. The tongue is midline, and the patient has symmetric elevation of the soft palate. No obvious hearing deficits are noted.  Motor: The motor testing reveals 5 over 5 strength of all 4 extremities. Good symmetric motor tone is noted throughout.  Sensory: Sensory testing is intact to pinprick, soft touch, vibration sensation, and position sense on all 4 extremities. No evidence of extinction is noted.  Coordination: Cerebellar testing reveals good finger-nose-finger and heel-to-shin bilaterally.  Gait and station: Gait is normal. Tandem gait is normal. Romberg is negative. No drift is seen.  Reflexes: Deep tendon reflexes are symmetric and normal bilaterally. Toes are downgoing bilaterally.   Assessment/Plan:   1. Syncopal event   The patient has had several syncopal episodes throughout her life. The patient likely susceptible to vasovagal syncope, which sometimes may be associated with urinary incontinence. I do not believe that the patient is having seizure episodes, but we will perform a brief workup. She will have MRI evaluation of the brain, and an EEG study. We will get a 30 day cardiac monitor study. She will follow-up through this office on an as-needed basis. I have asked her not to operate a motor vehicle until we can get through the workup.  Cindy Mcknight. Cindy Kmya Placide MD 12/22/2015 3:39 PM  [facility] [address] [place], [place] [id number]  Phone [phone] Fax [phone]

## 2015-12-25 ENCOUNTER — Other Ambulatory Visit: Payer: BC Managed Care – PPO

## 2015-12-25 ENCOUNTER — Telehealth: Payer: Self-pay | Admitting: Adult Health

## 2015-12-25 NOTE — Telephone Encounter (Signed)
I attempted to call the patient but number was disconnected. Our office will be closed monday due to the weather.

## 2015-12-27 ENCOUNTER — Ambulatory Visit (INDEPENDENT_AMBULATORY_CARE_PROVIDER_SITE_OTHER): Payer: Self-pay

## 2015-12-27 DIAGNOSIS — Z0289 Encounter for other administrative examinations: Secondary | ICD-10-CM

## 2015-12-27 DIAGNOSIS — R55 Syncope and collapse: Secondary | ICD-10-CM

## 2015-12-29 ENCOUNTER — Telehealth: Payer: Self-pay | Admitting: Neurology

## 2015-12-29 NOTE — Telephone Encounter (Signed)
I called the patient. MRI the brain shows very minimal white matter changes, of no clinical significance. I discussed this with Cindy Mcknight. EEG study and a 30 day cardiac monitoring study are pending.   MRI brain 12/28/15:  IMPRESSION:  Equivocal MRI brain (without) demonstrating: 1. Minimal periventricular and subcortical foci of non-specific gliosis. 2. No acute findings.

## 2016-01-23 ENCOUNTER — Other Ambulatory Visit: Payer: BC Managed Care – PPO

## 2016-01-24 ENCOUNTER — Ambulatory Visit (INDEPENDENT_AMBULATORY_CARE_PROVIDER_SITE_OTHER): Payer: BC Managed Care – PPO | Admitting: Neurology

## 2016-01-24 ENCOUNTER — Telehealth: Payer: Self-pay | Admitting: Neurology

## 2016-01-24 ENCOUNTER — Encounter: Payer: Self-pay | Admitting: Neurology

## 2016-01-24 DIAGNOSIS — R55 Syncope and collapse: Secondary | ICD-10-CM | POA: Diagnosis not present

## 2016-01-24 NOTE — Procedures (Signed)
    History:  Cindy Mcknight is a 60 year old patient with a history of multiple episodes of syncope throughout her life, most recent event occurred on the 31st of November 2016. The patient had brief loss of consciousness associated with urinary incontinence. The patient being evaluated for possible seizures.  This is a routine EEG. No skull defects are noted. Medications include Invokana, Estrace, lisinopril, metformin, multivitamins, and probiotic.   EEG classification: Normal awake  Description of the recording: The background rhythms of this recording consists of a fairly well modulated medium amplitude alpha rhythm of 8 Hz that is reactive to eye opening and closure. As the record progresses, the patient appears to remain in the waking state throughout the recording. Photic stimulation was performed, resulting in a bilateral and symmetric photic driving response. Hyperventilation was also performed, resulting in a minimal buildup of the background rhythm activities without significant slowing seen. At no time during the recording does there appear to be evidence of spike or spike wave discharges or evidence of focal slowing. EKG monitor shows no evidence of cardiac rhythm abnormalities with a heart rate of 78.  Impression: This is a normal EEG recording in the waking state. No evidence of ictal or interictal discharges are seen.

## 2016-01-24 NOTE — Telephone Encounter (Signed)
I called patient. The EEG study was normal. The cardiac monitor study is pending, the patient has not heard anything, we will need to find out why.

## 2016-01-25 ENCOUNTER — Telehealth: Payer: Self-pay | Admitting: Family Medicine

## 2016-01-25 NOTE — Telephone Encounter (Signed)
I called [facility] [phone]) and spoke to [place]. She is going to put in a message for one of the nurses to call me back about this patient.

## 2016-01-25 NOTE — Telephone Encounter (Signed)
Earney Navy w/ [facility] put in an order for a 30 day event monitor for this pt

## 2016-01-25 NOTE — Telephone Encounter (Signed)
Patient enrolled in [employer]. Called pt. No answer. Left message to call back

## 2016-01-28 ENCOUNTER — Ambulatory Visit (INDEPENDENT_AMBULATORY_CARE_PROVIDER_SITE_OTHER): Payer: BC Managed Care – PPO

## 2016-01-28 DIAGNOSIS — R55 Syncope and collapse: Secondary | ICD-10-CM | POA: Diagnosis not present

## 2016-01-30 NOTE — Telephone Encounter (Signed)
It appears the cardiac monitor study is in process in Epic. I called the patient to find out if this is the case. I left a voicemail asking her to call me back. Please find out if she has heard from [facility] and if she has the cardiac monitor on when she calls back.

## 2016-01-30 NOTE — Telephone Encounter (Signed)
Patient returned Kelby's call, Earney Navy advised per patient, that 1)She hasn't heard from [facility], 2) She is wearing the monitor. Patient states she put the monitor on herself by directions from the [facility]. Advised patient, per Earney Navy, that she probably won't hear from [facility], they will send Dr. Anne Hahn the results after the completion of the study and Dr. Anne Hahn will call her.

## 2016-02-01 ENCOUNTER — Telehealth: Payer: Self-pay | Admitting: Cardiology

## 2016-02-01 NOTE — Telephone Encounter (Signed)
Pt states that she is put on hold when calling the company to notify them that she is going to exercise.

## 2016-02-01 NOTE — Telephone Encounter (Signed)
Pt would like to speak w/ someone concerning her event monitor, pt states she's having a hard time getting in touch with the company

## 2016-02-27 ENCOUNTER — Telehealth: Payer: Self-pay | Admitting: Neurology

## 2016-02-27 NOTE — Telephone Encounter (Signed)
I called the patient. The cardiac monitor was ok, no significant cardiac rhythm abnormalities.   30 day event recorder reviewed. Ordered by Dr. Stephanie Acreharles Willis. Sinus rhythm was noted throughout the recording with a rare PAC. No atrial fibrillation documented. No pauses.

## 2016-09-20 ENCOUNTER — Other Ambulatory Visit (HOSPITAL_COMMUNITY): Payer: Self-pay | Admitting: Obstetrics and Gynecology

## 2016-09-20 DIAGNOSIS — Z1231 Encounter for screening mammogram for malignant neoplasm of breast: Secondary | ICD-10-CM

## 2016-09-23 ENCOUNTER — Ambulatory Visit (HOSPITAL_COMMUNITY)
Admission: RE | Admit: 2016-09-23 | Discharge: 2016-09-23 | Disposition: A | Discharge status: Home / Self Care | Payer: BC Managed Care – PPO | Source: Ambulatory Visit | Attending: Obstetrics and Gynecology | Admitting: Obstetrics and Gynecology

## 2016-09-23 DIAGNOSIS — Z1231 Encounter for screening mammogram for malignant neoplasm of breast: Secondary | ICD-10-CM | POA: Insufficient documentation

## 2018-10-13 ENCOUNTER — Other Ambulatory Visit (HOSPITAL_COMMUNITY): Payer: Self-pay | Admitting: Family Medicine

## 2018-10-13 DIAGNOSIS — Z1231 Encounter for screening mammogram for malignant neoplasm of breast: Secondary | ICD-10-CM

## 2018-10-29 ENCOUNTER — Encounter (HOSPITAL_COMMUNITY): Payer: Self-pay

## 2018-10-29 ENCOUNTER — Ambulatory Visit (HOSPITAL_COMMUNITY)
Admission: RE | Admit: 2018-10-29 | Discharge: 2018-10-29 | Disposition: A | Discharge status: Home / Self Care | Payer: BC Managed Care – PPO | Source: Ambulatory Visit | Attending: Family Medicine | Admitting: Family Medicine

## 2018-10-29 DIAGNOSIS — Z1231 Encounter for screening mammogram for malignant neoplasm of breast: Secondary | ICD-10-CM | POA: Insufficient documentation

## 2020-07-20 DIAGNOSIS — E1165 Type 2 diabetes mellitus with hyperglycemia: Secondary | ICD-10-CM | POA: Diagnosis not present

## 2020-07-20 DIAGNOSIS — E1121 Type 2 diabetes mellitus with diabetic nephropathy: Secondary | ICD-10-CM | POA: Diagnosis not present

## 2020-07-20 DIAGNOSIS — R5382 Chronic fatigue, unspecified: Secondary | ICD-10-CM | POA: Diagnosis not present

## 2020-07-20 DIAGNOSIS — E7800 Pure hypercholesterolemia, unspecified: Secondary | ICD-10-CM | POA: Diagnosis not present

## 2020-07-20 DIAGNOSIS — N183 Chronic kidney disease, stage 3 unspecified: Secondary | ICD-10-CM | POA: Diagnosis not present

## 2020-07-24 DIAGNOSIS — E7800 Pure hypercholesterolemia, unspecified: Secondary | ICD-10-CM | POA: Diagnosis not present

## 2020-07-24 DIAGNOSIS — E1121 Type 2 diabetes mellitus with diabetic nephropathy: Secondary | ICD-10-CM | POA: Diagnosis not present

## 2020-07-24 DIAGNOSIS — E6609 Other obesity due to excess calories: Secondary | ICD-10-CM | POA: Diagnosis not present

## 2020-07-24 DIAGNOSIS — Z6830 Body mass index (BMI) 30.0-30.9, adult: Secondary | ICD-10-CM | POA: Diagnosis not present

## 2020-11-15 ENCOUNTER — Other Ambulatory Visit (HOSPITAL_COMMUNITY): Payer: Self-pay | Admitting: Family Medicine

## 2020-11-15 DIAGNOSIS — Z1231 Encounter for screening mammogram for malignant neoplasm of breast: Secondary | ICD-10-CM

## 2020-11-20 ENCOUNTER — Ambulatory Visit (HOSPITAL_COMMUNITY)
Admission: RE | Admit: 2020-11-20 | Discharge: 2020-11-20 | Disposition: A | Discharge status: Home / Self Care | Payer: Medicare PPO | Source: Ambulatory Visit | Attending: Family Medicine | Admitting: Family Medicine

## 2020-11-20 ENCOUNTER — Other Ambulatory Visit: Payer: Self-pay

## 2020-11-20 DIAGNOSIS — Z1231 Encounter for screening mammogram for malignant neoplasm of breast: Secondary | ICD-10-CM

## 2021-01-24 DIAGNOSIS — H524 Presbyopia: Secondary | ICD-10-CM | POA: Diagnosis not present

## 2021-01-24 DIAGNOSIS — Z961 Presence of intraocular lens: Secondary | ICD-10-CM | POA: Diagnosis not present

## 2021-01-24 DIAGNOSIS — E119 Type 2 diabetes mellitus without complications: Secondary | ICD-10-CM | POA: Diagnosis not present

## 2021-02-02 DIAGNOSIS — N183 Chronic kidney disease, stage 3 unspecified: Secondary | ICD-10-CM | POA: Diagnosis not present

## 2021-02-02 DIAGNOSIS — E7800 Pure hypercholesterolemia, unspecified: Secondary | ICD-10-CM | POA: Diagnosis not present

## 2021-02-02 DIAGNOSIS — E7801 Familial hypercholesterolemia: Secondary | ICD-10-CM | POA: Diagnosis not present

## 2021-02-02 DIAGNOSIS — R5382 Chronic fatigue, unspecified: Secondary | ICD-10-CM | POA: Diagnosis not present

## 2021-02-02 DIAGNOSIS — E1121 Type 2 diabetes mellitus with diabetic nephropathy: Secondary | ICD-10-CM | POA: Diagnosis not present

## 2021-02-05 DIAGNOSIS — E1121 Type 2 diabetes mellitus with diabetic nephropathy: Secondary | ICD-10-CM | POA: Diagnosis not present

## 2021-02-05 DIAGNOSIS — R682 Dry mouth, unspecified: Secondary | ICD-10-CM | POA: Diagnosis not present

## 2021-02-05 DIAGNOSIS — Z6832 Body mass index (BMI) 32.0-32.9, adult: Secondary | ICD-10-CM | POA: Diagnosis not present

## 2021-02-05 DIAGNOSIS — Z0001 Encounter for general adult medical examination with abnormal findings: Secondary | ICD-10-CM | POA: Diagnosis not present

## 2021-02-05 DIAGNOSIS — Z23 Encounter for immunization: Secondary | ICD-10-CM | POA: Diagnosis not present

## 2021-02-05 DIAGNOSIS — E1129 Type 2 diabetes mellitus with other diabetic kidney complication: Secondary | ICD-10-CM | POA: Diagnosis not present

## 2021-02-05 DIAGNOSIS — E7801 Familial hypercholesterolemia: Secondary | ICD-10-CM | POA: Diagnosis not present

## 2021-02-05 DIAGNOSIS — N183 Chronic kidney disease, stage 3 unspecified: Secondary | ICD-10-CM | POA: Diagnosis not present

## 2021-08-06 DIAGNOSIS — E1121 Type 2 diabetes mellitus with diabetic nephropathy: Secondary | ICD-10-CM | POA: Diagnosis not present

## 2021-08-06 DIAGNOSIS — E7801 Familial hypercholesterolemia: Secondary | ICD-10-CM | POA: Diagnosis not present

## 2021-08-06 DIAGNOSIS — N183 Chronic kidney disease, stage 3 unspecified: Secondary | ICD-10-CM | POA: Diagnosis not present

## 2021-08-06 DIAGNOSIS — E1165 Type 2 diabetes mellitus with hyperglycemia: Secondary | ICD-10-CM | POA: Diagnosis not present

## 2021-08-06 DIAGNOSIS — E1129 Type 2 diabetes mellitus with other diabetic kidney complication: Secondary | ICD-10-CM | POA: Diagnosis not present

## 2021-08-06 DIAGNOSIS — E7800 Pure hypercholesterolemia, unspecified: Secondary | ICD-10-CM | POA: Diagnosis not present

## 2021-08-06 DIAGNOSIS — R5382 Chronic fatigue, unspecified: Secondary | ICD-10-CM | POA: Diagnosis not present

## 2021-08-09 DIAGNOSIS — E1121 Type 2 diabetes mellitus with diabetic nephropathy: Secondary | ICD-10-CM | POA: Diagnosis not present

## 2021-08-09 DIAGNOSIS — E6609 Other obesity due to excess calories: Secondary | ICD-10-CM | POA: Diagnosis not present

## 2021-08-09 DIAGNOSIS — E7801 Familial hypercholesterolemia: Secondary | ICD-10-CM | POA: Diagnosis not present

## 2021-08-09 DIAGNOSIS — Z6835 Body mass index (BMI) 35.0-35.9, adult: Secondary | ICD-10-CM | POA: Diagnosis not present

## 2021-10-08 DIAGNOSIS — Z20828 Contact with and (suspected) exposure to other viral communicable diseases: Secondary | ICD-10-CM | POA: Diagnosis not present

## 2021-10-08 DIAGNOSIS — J029 Acute pharyngitis, unspecified: Secondary | ICD-10-CM | POA: Diagnosis not present

## 2021-10-08 DIAGNOSIS — R059 Cough, unspecified: Secondary | ICD-10-CM | POA: Diagnosis not present

## 2021-10-15 DIAGNOSIS — R0602 Shortness of breath: Secondary | ICD-10-CM | POA: Diagnosis not present

## 2021-10-15 DIAGNOSIS — R059 Cough, unspecified: Secondary | ICD-10-CM | POA: Diagnosis not present

## 2021-11-06 DIAGNOSIS — Z20828 Contact with and (suspected) exposure to other viral communicable diseases: Secondary | ICD-10-CM | POA: Diagnosis not present

## 2021-11-06 DIAGNOSIS — J209 Acute bronchitis, unspecified: Secondary | ICD-10-CM | POA: Diagnosis not present

## 2021-11-30 DIAGNOSIS — E1121 Type 2 diabetes mellitus with diabetic nephropathy: Secondary | ICD-10-CM | POA: Diagnosis not present

## 2021-11-30 DIAGNOSIS — R5383 Other fatigue: Secondary | ICD-10-CM | POA: Diagnosis not present

## 2021-11-30 DIAGNOSIS — E7801 Familial hypercholesterolemia: Secondary | ICD-10-CM | POA: Diagnosis not present

## 2021-11-30 DIAGNOSIS — N189 Chronic kidney disease, unspecified: Secondary | ICD-10-CM | POA: Diagnosis not present

## 2021-11-30 DIAGNOSIS — E7800 Pure hypercholesterolemia, unspecified: Secondary | ICD-10-CM | POA: Diagnosis not present

## 2021-11-30 DIAGNOSIS — E559 Vitamin D deficiency, unspecified: Secondary | ICD-10-CM | POA: Diagnosis not present

## 2021-12-05 DIAGNOSIS — E1121 Type 2 diabetes mellitus with diabetic nephropathy: Secondary | ICD-10-CM | POA: Diagnosis not present

## 2021-12-05 DIAGNOSIS — Z6836 Body mass index (BMI) 36.0-36.9, adult: Secondary | ICD-10-CM | POA: Diagnosis not present

## 2021-12-05 DIAGNOSIS — Z23 Encounter for immunization: Secondary | ICD-10-CM | POA: Diagnosis not present

## 2021-12-05 DIAGNOSIS — E7801 Familial hypercholesterolemia: Secondary | ICD-10-CM | POA: Diagnosis not present

## 2022-02-04 DIAGNOSIS — E559 Vitamin D deficiency, unspecified: Secondary | ICD-10-CM | POA: Diagnosis not present

## 2022-02-28 IMAGING — MG DIGITAL SCREENING BILAT W/ TOMO W/ CAD
8 series · 8 of 24 positions shown · non-contrast
Comparison: Previous exam(s).

CLINICAL DATA: Screening.

EXAM:
DIGITAL SCREENING BILATERAL MAMMOGRAM WITH TOMO AND CAD

[L MLO synth-2D]
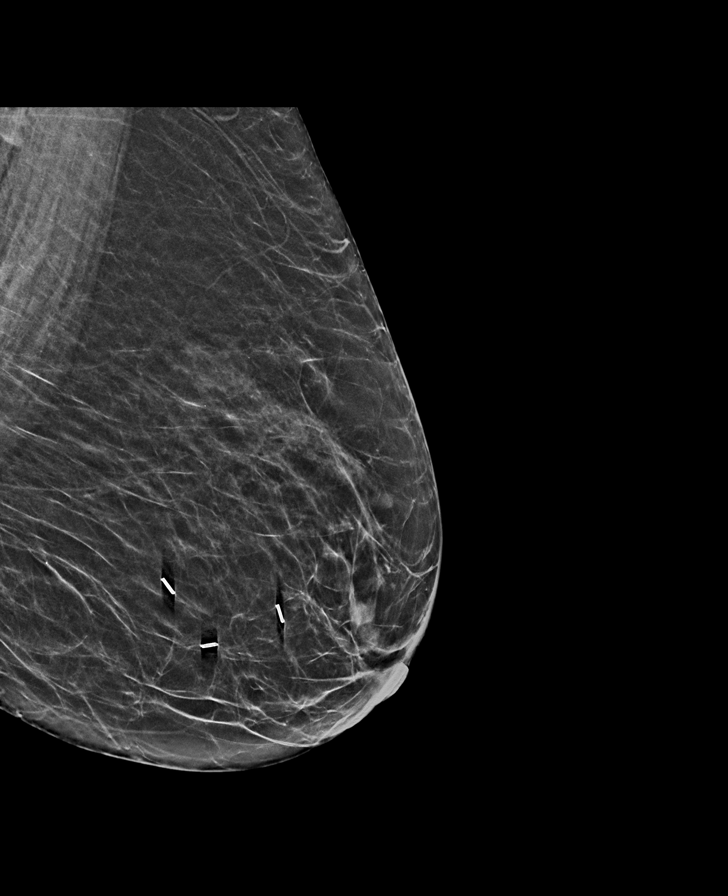

[R MLO synth-2D]
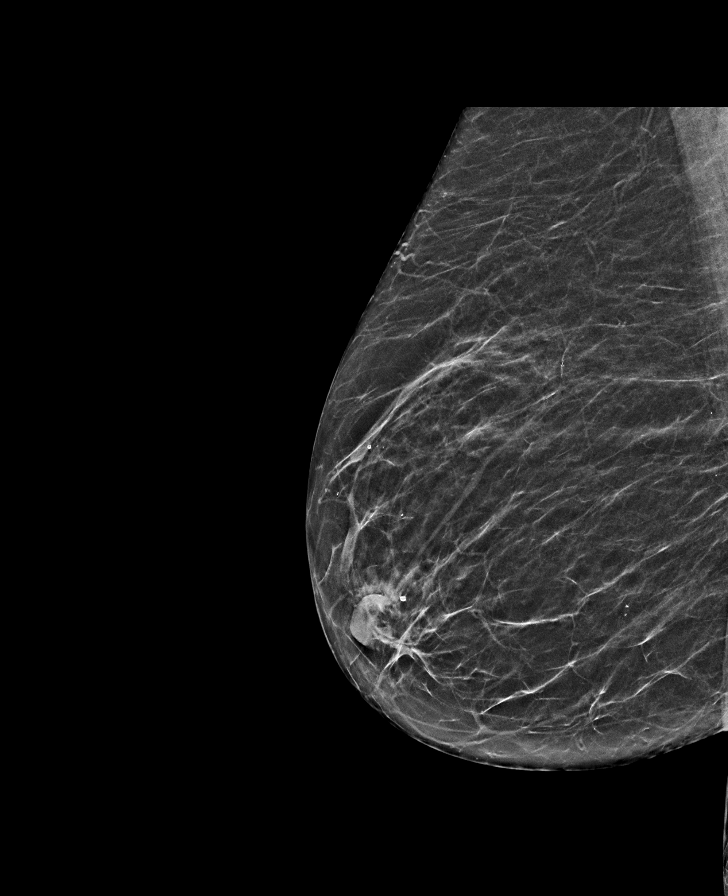

[L CC synth-2D]
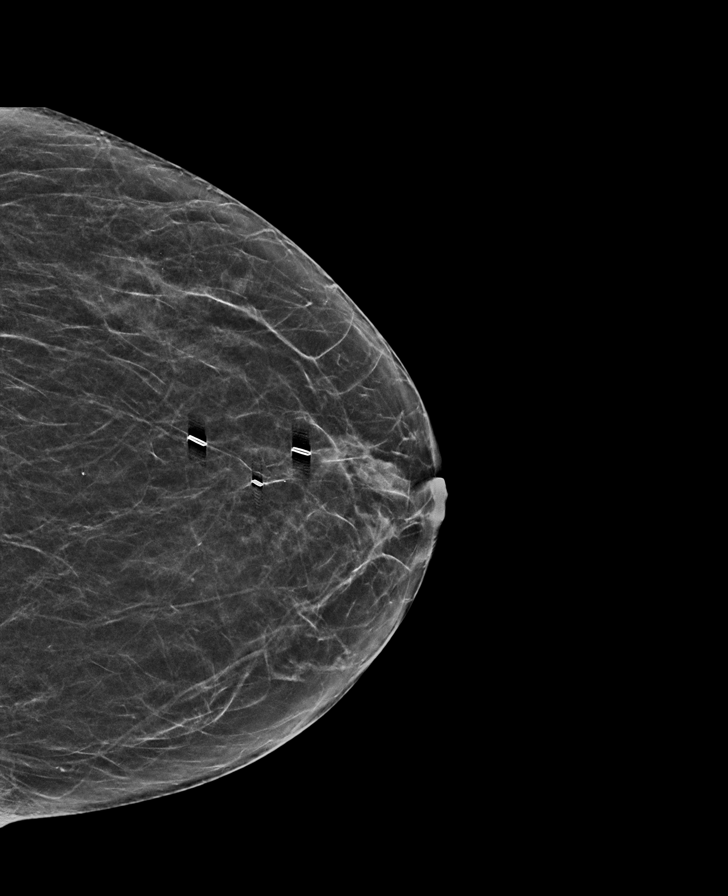

[R CC synth-2D]
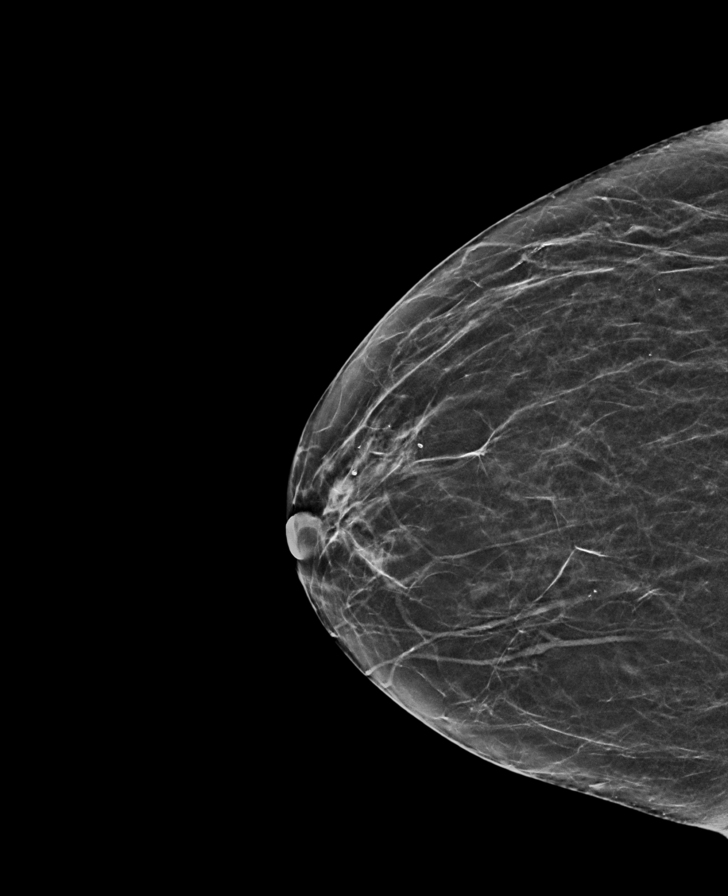

[R CC tomo · tomo slice 25/49.0]
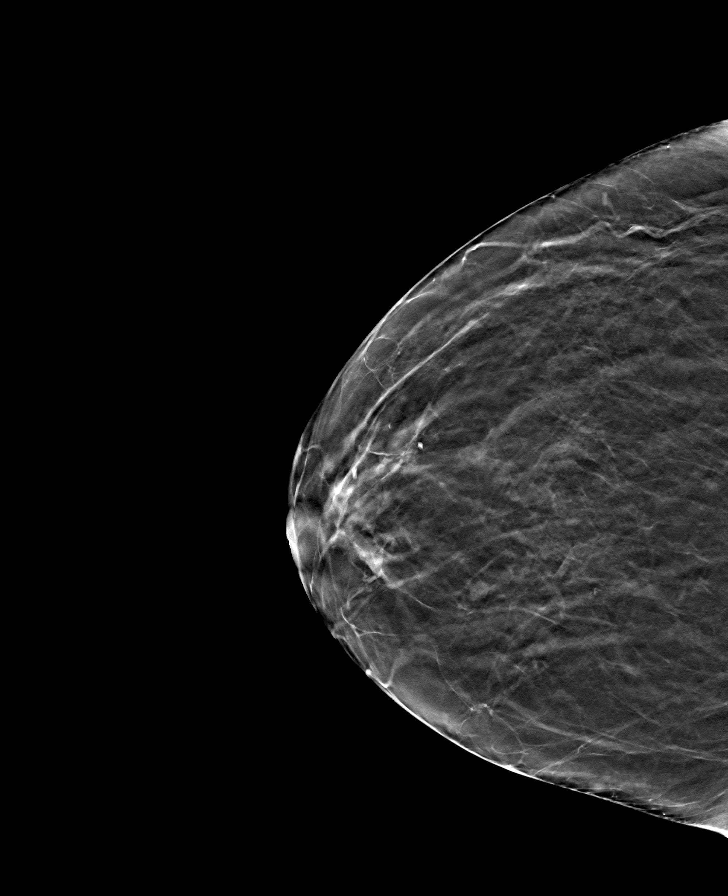

[L CC tomo · tomo slice 27/53.0]
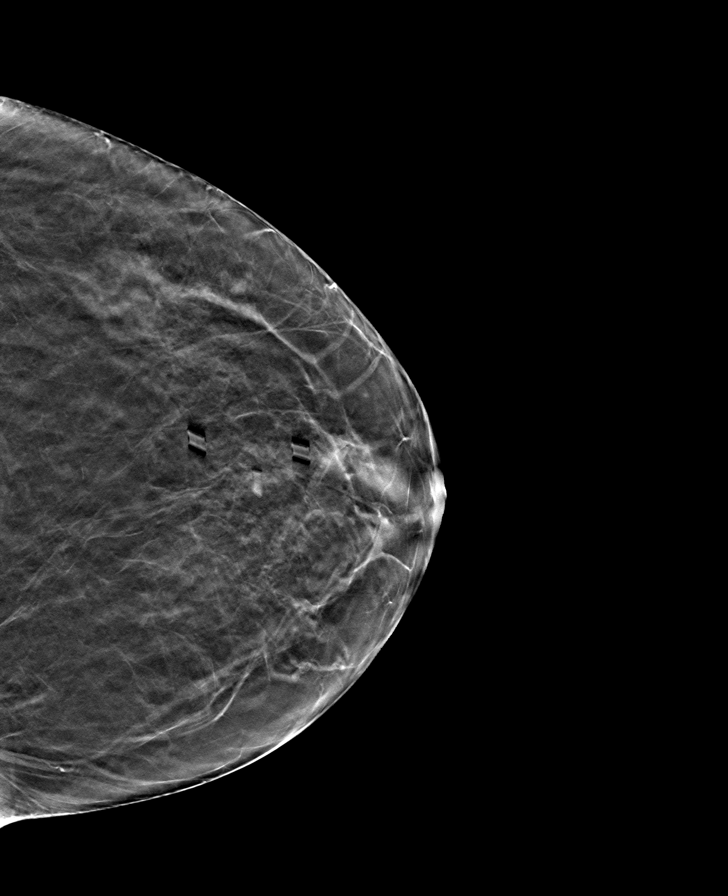

[L MLO tomo · tomo slice 27/54.0]
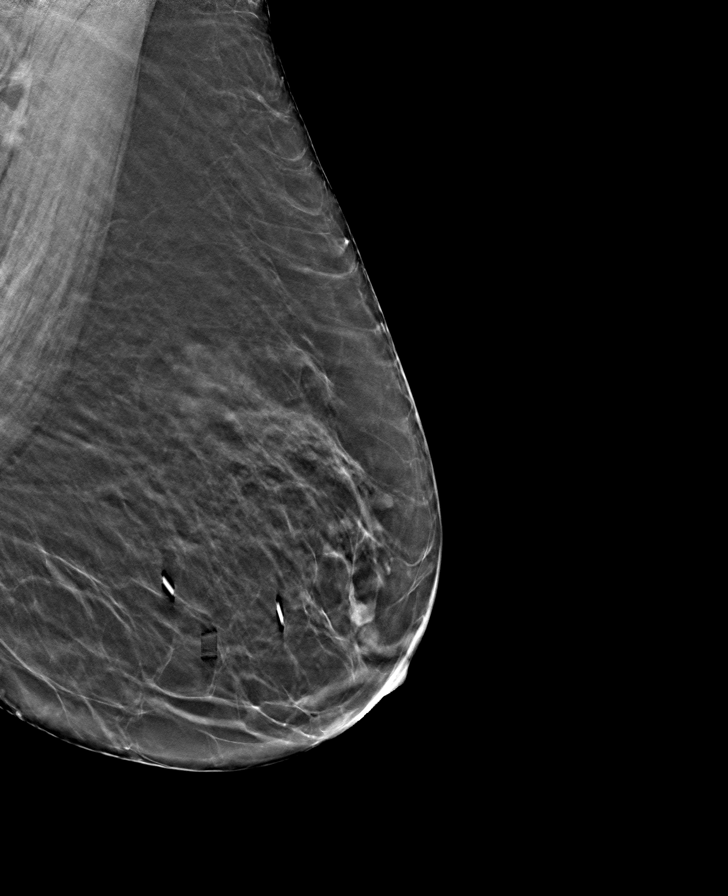

[R MLO tomo · tomo slice 27/52.0]
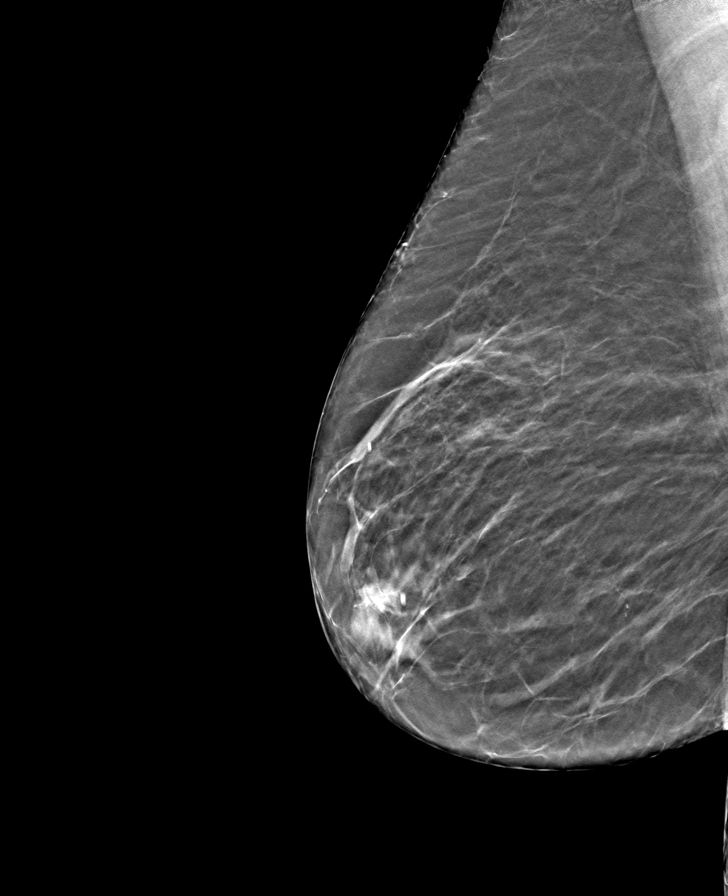

[8 of 24 positions shown; findings below may reference images not displayed]

ACR Breast Density Category b: There are scattered areas of
fibroglandular density.
FINDINGS: There are no findings suspicious for malignancy. Images were
processed with CAD.
IMPRESSION: No mammographic evidence of malignancy. A result letter of this
screening mammogram will be mailed directly to the patient.

RECOMMENDATION:
Screening mammogram in one year. (Code:[id number])

BI-RADS CATEGORY  1: Negative.

## 2022-04-08 DIAGNOSIS — E7801 Familial hypercholesterolemia: Secondary | ICD-10-CM | POA: Diagnosis not present

## 2022-04-08 DIAGNOSIS — N183 Chronic kidney disease, stage 3 unspecified: Secondary | ICD-10-CM | POA: Diagnosis not present

## 2022-04-08 DIAGNOSIS — E559 Vitamin D deficiency, unspecified: Secondary | ICD-10-CM | POA: Diagnosis not present

## 2022-04-08 DIAGNOSIS — E039 Hypothyroidism, unspecified: Secondary | ICD-10-CM | POA: Diagnosis not present

## 2022-04-08 DIAGNOSIS — E7800 Pure hypercholesterolemia, unspecified: Secondary | ICD-10-CM | POA: Diagnosis not present

## 2022-04-08 DIAGNOSIS — E1121 Type 2 diabetes mellitus with diabetic nephropathy: Secondary | ICD-10-CM | POA: Diagnosis not present

## 2022-04-11 DIAGNOSIS — E1122 Type 2 diabetes mellitus with diabetic chronic kidney disease: Secondary | ICD-10-CM | POA: Diagnosis not present

## 2022-04-11 DIAGNOSIS — R7989 Other specified abnormal findings of blood chemistry: Secondary | ICD-10-CM | POA: Diagnosis not present

## 2022-04-11 DIAGNOSIS — I10 Essential (primary) hypertension: Secondary | ICD-10-CM | POA: Diagnosis not present

## 2022-04-11 DIAGNOSIS — N189 Chronic kidney disease, unspecified: Secondary | ICD-10-CM | POA: Diagnosis not present

## 2022-04-11 DIAGNOSIS — E1121 Type 2 diabetes mellitus with diabetic nephropathy: Secondary | ICD-10-CM | POA: Diagnosis not present

## 2022-04-11 DIAGNOSIS — M179 Osteoarthritis of knee, unspecified: Secondary | ICD-10-CM | POA: Diagnosis not present

## 2022-04-11 DIAGNOSIS — R5383 Other fatigue: Secondary | ICD-10-CM | POA: Diagnosis not present

## 2022-04-11 DIAGNOSIS — E7801 Familial hypercholesterolemia: Secondary | ICD-10-CM | POA: Diagnosis not present

## 2022-04-22 ENCOUNTER — Encounter: Payer: Self-pay | Admitting: *Deleted

## 2022-06-07 DIAGNOSIS — M25561 Pain in right knee: Secondary | ICD-10-CM | POA: Diagnosis not present

## 2022-06-19 DIAGNOSIS — M25561 Pain in right knee: Secondary | ICD-10-CM | POA: Diagnosis not present

## 2022-06-19 DIAGNOSIS — M6281 Muscle weakness (generalized): Secondary | ICD-10-CM | POA: Diagnosis not present

## 2022-06-21 DIAGNOSIS — M6281 Muscle weakness (generalized): Secondary | ICD-10-CM | POA: Diagnosis not present

## 2022-06-21 DIAGNOSIS — M25561 Pain in right knee: Secondary | ICD-10-CM | POA: Diagnosis not present

## 2022-06-24 DIAGNOSIS — M25561 Pain in right knee: Secondary | ICD-10-CM | POA: Diagnosis not present

## 2022-06-24 DIAGNOSIS — M6281 Muscle weakness (generalized): Secondary | ICD-10-CM | POA: Diagnosis not present

## 2022-06-26 DIAGNOSIS — M6281 Muscle weakness (generalized): Secondary | ICD-10-CM | POA: Diagnosis not present

## 2022-06-26 DIAGNOSIS — M25561 Pain in right knee: Secondary | ICD-10-CM | POA: Diagnosis not present

## 2022-06-27 DIAGNOSIS — M25561 Pain in right knee: Secondary | ICD-10-CM | POA: Diagnosis not present

## 2022-07-01 DIAGNOSIS — M6281 Muscle weakness (generalized): Secondary | ICD-10-CM | POA: Diagnosis not present

## 2022-07-01 DIAGNOSIS — M25561 Pain in right knee: Secondary | ICD-10-CM | POA: Diagnosis not present

## 2022-07-04 DIAGNOSIS — M6281 Muscle weakness (generalized): Secondary | ICD-10-CM | POA: Diagnosis not present

## 2022-07-04 DIAGNOSIS — M25561 Pain in right knee: Secondary | ICD-10-CM | POA: Diagnosis not present

## 2022-07-09 DIAGNOSIS — M6281 Muscle weakness (generalized): Secondary | ICD-10-CM | POA: Diagnosis not present

## 2022-07-09 DIAGNOSIS — M25561 Pain in right knee: Secondary | ICD-10-CM | POA: Diagnosis not present

## 2022-07-11 DIAGNOSIS — M6281 Muscle weakness (generalized): Secondary | ICD-10-CM | POA: Diagnosis not present

## 2022-07-11 DIAGNOSIS — M25561 Pain in right knee: Secondary | ICD-10-CM | POA: Diagnosis not present

## 2022-07-22 DIAGNOSIS — M6281 Muscle weakness (generalized): Secondary | ICD-10-CM | POA: Diagnosis not present

## 2022-07-22 DIAGNOSIS — M25561 Pain in right knee: Secondary | ICD-10-CM | POA: Diagnosis not present

## 2022-07-24 DIAGNOSIS — M25561 Pain in right knee: Secondary | ICD-10-CM | POA: Diagnosis not present

## 2022-07-24 DIAGNOSIS — M6281 Muscle weakness (generalized): Secondary | ICD-10-CM | POA: Diagnosis not present

## 2022-07-25 DIAGNOSIS — M25561 Pain in right knee: Secondary | ICD-10-CM | POA: Diagnosis not present

## 2022-07-25 DIAGNOSIS — M1711 Unilateral primary osteoarthritis, right knee: Secondary | ICD-10-CM | POA: Diagnosis not present

## 2022-07-29 DIAGNOSIS — M6281 Muscle weakness (generalized): Secondary | ICD-10-CM | POA: Diagnosis not present

## 2022-07-29 DIAGNOSIS — M25561 Pain in right knee: Secondary | ICD-10-CM | POA: Diagnosis not present

## 2022-08-01 DIAGNOSIS — M25561 Pain in right knee: Secondary | ICD-10-CM | POA: Diagnosis not present

## 2022-08-01 DIAGNOSIS — M6281 Muscle weakness (generalized): Secondary | ICD-10-CM | POA: Diagnosis not present

## 2022-08-05 DIAGNOSIS — E1165 Type 2 diabetes mellitus with hyperglycemia: Secondary | ICD-10-CM | POA: Diagnosis not present

## 2022-08-05 DIAGNOSIS — M25561 Pain in right knee: Secondary | ICD-10-CM | POA: Diagnosis not present

## 2022-08-05 DIAGNOSIS — R5383 Other fatigue: Secondary | ICD-10-CM | POA: Diagnosis not present

## 2022-08-05 DIAGNOSIS — I10 Essential (primary) hypertension: Secondary | ICD-10-CM | POA: Diagnosis not present

## 2022-08-05 DIAGNOSIS — N189 Chronic kidney disease, unspecified: Secondary | ICD-10-CM | POA: Diagnosis not present

## 2022-08-05 DIAGNOSIS — E7801 Familial hypercholesterolemia: Secondary | ICD-10-CM | POA: Diagnosis not present

## 2022-08-05 DIAGNOSIS — M6281 Muscle weakness (generalized): Secondary | ICD-10-CM | POA: Diagnosis not present

## 2022-08-05 DIAGNOSIS — E7800 Pure hypercholesterolemia, unspecified: Secondary | ICD-10-CM | POA: Diagnosis not present

## 2022-08-05 DIAGNOSIS — E559 Vitamin D deficiency, unspecified: Secondary | ICD-10-CM | POA: Diagnosis not present

## 2022-08-08 DIAGNOSIS — M6281 Muscle weakness (generalized): Secondary | ICD-10-CM | POA: Diagnosis not present

## 2022-08-08 DIAGNOSIS — M25561 Pain in right knee: Secondary | ICD-10-CM | POA: Diagnosis not present

## 2022-08-09 DIAGNOSIS — E7801 Familial hypercholesterolemia: Secondary | ICD-10-CM | POA: Diagnosis not present

## 2022-08-09 DIAGNOSIS — M179 Osteoarthritis of knee, unspecified: Secondary | ICD-10-CM | POA: Diagnosis not present

## 2022-08-09 DIAGNOSIS — I10 Essential (primary) hypertension: Secondary | ICD-10-CM | POA: Diagnosis not present

## 2022-08-09 DIAGNOSIS — E1121 Type 2 diabetes mellitus with diabetic nephropathy: Secondary | ICD-10-CM | POA: Diagnosis not present

## 2022-08-09 DIAGNOSIS — R7989 Other specified abnormal findings of blood chemistry: Secondary | ICD-10-CM | POA: Diagnosis not present

## 2022-08-09 DIAGNOSIS — R5383 Other fatigue: Secondary | ICD-10-CM | POA: Diagnosis not present

## 2022-08-09 DIAGNOSIS — E1122 Type 2 diabetes mellitus with diabetic chronic kidney disease: Secondary | ICD-10-CM | POA: Diagnosis not present

## 2022-08-09 DIAGNOSIS — N189 Chronic kidney disease, unspecified: Secondary | ICD-10-CM | POA: Diagnosis not present

## 2022-08-14 DIAGNOSIS — M25561 Pain in right knee: Secondary | ICD-10-CM | POA: Diagnosis not present

## 2022-08-14 DIAGNOSIS — M6281 Muscle weakness (generalized): Secondary | ICD-10-CM | POA: Diagnosis not present

## 2022-08-22 DIAGNOSIS — M6281 Muscle weakness (generalized): Secondary | ICD-10-CM | POA: Diagnosis not present

## 2022-08-22 DIAGNOSIS — M25561 Pain in right knee: Secondary | ICD-10-CM | POA: Diagnosis not present

## 2022-08-26 DIAGNOSIS — M25561 Pain in right knee: Secondary | ICD-10-CM | POA: Diagnosis not present

## 2022-08-26 DIAGNOSIS — M6281 Muscle weakness (generalized): Secondary | ICD-10-CM | POA: Diagnosis not present

## 2022-08-29 DIAGNOSIS — M25561 Pain in right knee: Secondary | ICD-10-CM | POA: Diagnosis not present

## 2022-08-29 DIAGNOSIS — M6281 Muscle weakness (generalized): Secondary | ICD-10-CM | POA: Diagnosis not present

## 2022-09-25 ENCOUNTER — Other Ambulatory Visit (HOSPITAL_COMMUNITY): Payer: Self-pay | Admitting: Family Medicine

## 2022-09-25 DIAGNOSIS — Z1231 Encounter for screening mammogram for malignant neoplasm of breast: Secondary | ICD-10-CM

## 2022-10-11 DIAGNOSIS — Z961 Presence of intraocular lens: Secondary | ICD-10-CM | POA: Diagnosis not present

## 2022-10-11 DIAGNOSIS — E119 Type 2 diabetes mellitus without complications: Secondary | ICD-10-CM | POA: Diagnosis not present

## 2022-10-14 ENCOUNTER — Encounter (HOSPITAL_COMMUNITY): Payer: Self-pay

## 2022-10-14 ENCOUNTER — Ambulatory Visit (HOSPITAL_COMMUNITY)
Admission: RE | Admit: 2022-10-14 | Discharge: 2022-10-14 | Disposition: A | Discharge status: Home / Self Care | Payer: Medicare PPO | Source: Ambulatory Visit | Attending: Family Medicine | Admitting: Family Medicine

## 2022-10-14 DIAGNOSIS — Z1231 Encounter for screening mammogram for malignant neoplasm of breast: Secondary | ICD-10-CM | POA: Insufficient documentation

## 2022-11-06 DIAGNOSIS — Z6836 Body mass index (BMI) 36.0-36.9, adult: Secondary | ICD-10-CM | POA: Diagnosis not present

## 2022-11-06 DIAGNOSIS — M5430 Sciatica, unspecified side: Secondary | ICD-10-CM | POA: Diagnosis not present

## 2022-11-06 DIAGNOSIS — M545 Low back pain, unspecified: Secondary | ICD-10-CM | POA: Diagnosis not present

## 2022-11-06 DIAGNOSIS — R030 Elevated blood-pressure reading, without diagnosis of hypertension: Secondary | ICD-10-CM | POA: Diagnosis not present

## 2022-12-23 DIAGNOSIS — E1121 Type 2 diabetes mellitus with diabetic nephropathy: Secondary | ICD-10-CM | POA: Diagnosis not present

## 2022-12-23 DIAGNOSIS — I10 Essential (primary) hypertension: Secondary | ICD-10-CM | POA: Diagnosis not present

## 2022-12-23 DIAGNOSIS — E7849 Other hyperlipidemia: Secondary | ICD-10-CM | POA: Diagnosis not present

## 2022-12-23 DIAGNOSIS — N183 Chronic kidney disease, stage 3 unspecified: Secondary | ICD-10-CM | POA: Diagnosis not present

## 2022-12-23 DIAGNOSIS — E1122 Type 2 diabetes mellitus with diabetic chronic kidney disease: Secondary | ICD-10-CM | POA: Diagnosis not present

## 2022-12-23 DIAGNOSIS — E1165 Type 2 diabetes mellitus with hyperglycemia: Secondary | ICD-10-CM | POA: Diagnosis not present

## 2022-12-24 DIAGNOSIS — R7989 Other specified abnormal findings of blood chemistry: Secondary | ICD-10-CM | POA: Diagnosis not present

## 2022-12-24 DIAGNOSIS — E7800 Pure hypercholesterolemia, unspecified: Secondary | ICD-10-CM | POA: Diagnosis not present

## 2022-12-24 DIAGNOSIS — N1832 Chronic kidney disease, stage 3b: Secondary | ICD-10-CM | POA: Diagnosis not present

## 2022-12-24 DIAGNOSIS — M179 Osteoarthritis of knee, unspecified: Secondary | ICD-10-CM | POA: Diagnosis not present

## 2022-12-24 DIAGNOSIS — E1121 Type 2 diabetes mellitus with diabetic nephropathy: Secondary | ICD-10-CM | POA: Diagnosis not present

## 2022-12-24 DIAGNOSIS — I10 Essential (primary) hypertension: Secondary | ICD-10-CM | POA: Diagnosis not present

## 2022-12-24 DIAGNOSIS — R5383 Other fatigue: Secondary | ICD-10-CM | POA: Diagnosis not present

## 2022-12-24 DIAGNOSIS — E1122 Type 2 diabetes mellitus with diabetic chronic kidney disease: Secondary | ICD-10-CM | POA: Diagnosis not present

## 2022-12-26 DIAGNOSIS — N1832 Chronic kidney disease, stage 3b: Secondary | ICD-10-CM | POA: Diagnosis not present

## 2022-12-26 DIAGNOSIS — Z6835 Body mass index (BMI) 35.0-35.9, adult: Secondary | ICD-10-CM | POA: Diagnosis not present

## 2022-12-26 DIAGNOSIS — Z0001 Encounter for general adult medical examination with abnormal findings: Secondary | ICD-10-CM | POA: Diagnosis not present

## 2022-12-26 DIAGNOSIS — I10 Essential (primary) hypertension: Secondary | ICD-10-CM | POA: Diagnosis not present

## 2022-12-26 DIAGNOSIS — E1122 Type 2 diabetes mellitus with diabetic chronic kidney disease: Secondary | ICD-10-CM | POA: Diagnosis not present

## 2022-12-26 DIAGNOSIS — F17210 Nicotine dependence, cigarettes, uncomplicated: Secondary | ICD-10-CM | POA: Diagnosis not present

## 2022-12-26 DIAGNOSIS — E7801 Familial hypercholesterolemia: Secondary | ICD-10-CM | POA: Diagnosis not present

## 2022-12-31 DIAGNOSIS — M5416 Radiculopathy, lumbar region: Secondary | ICD-10-CM | POA: Diagnosis not present

## 2023-01-03 ENCOUNTER — Encounter: Payer: Self-pay | Admitting: *Deleted

## 2023-01-06 DIAGNOSIS — M810 Age-related osteoporosis without current pathological fracture: Secondary | ICD-10-CM | POA: Diagnosis not present

## 2023-01-06 DIAGNOSIS — M8589 Other specified disorders of bone density and structure, multiple sites: Secondary | ICD-10-CM | POA: Diagnosis not present

## 2023-01-09 DIAGNOSIS — M48061 Spinal stenosis, lumbar region without neurogenic claudication: Secondary | ICD-10-CM | POA: Diagnosis not present

## 2023-01-09 DIAGNOSIS — M5126 Other intervertebral disc displacement, lumbar region: Secondary | ICD-10-CM | POA: Diagnosis not present

## 2023-01-09 DIAGNOSIS — M5416 Radiculopathy, lumbar region: Secondary | ICD-10-CM | POA: Diagnosis not present

## 2023-01-14 DIAGNOSIS — M5416 Radiculopathy, lumbar region: Secondary | ICD-10-CM | POA: Diagnosis not present

## 2023-01-14 DIAGNOSIS — M4316 Spondylolisthesis, lumbar region: Secondary | ICD-10-CM | POA: Diagnosis not present

## 2023-01-14 DIAGNOSIS — Z6836 Body mass index (BMI) 36.0-36.9, adult: Secondary | ICD-10-CM | POA: Diagnosis not present

## 2023-01-17 ENCOUNTER — Other Ambulatory Visit: Payer: Self-pay | Admitting: Neurological Surgery

## 2023-01-29 NOTE — Pre-Procedure Instructions (Signed)
Surgical Instructions    Your procedure is scheduled on February 07, 2023.  Report to [facility] Main Entrance "A" at 8:00 A.M., then check in with the Admitting office.  Call this number if you have problems the morning of surgery:  [phone]  If you have any questions prior to your surgery date call [phone]: Open Monday-Friday 8am-4pm If you experience any cold or flu symptoms such as cough, fever, chills, shortness of breath, etc. between now and your scheduled surgery, please notify us at the above number.     Remember:  Do not eat or drink after midnight the night before your surgery     Take these medicines the morning of surgery with A SIP OF WATER:  NONE    As of today, STOP taking any Aspirin (unless otherwise instructed by your surgeon) Aleve, Naproxen, Ibuprofen, Motrin, Advil, Goody's, BC's, all herbal medications, fish oil, and all vitamins.           WHAT DO I DO ABOUT MY DIABETES MEDICATION?   Do not take metFORMIN (GLUCOPHAGE-XR) the morning of surgery.  STOP taking OZEMPIC one week prior to your surgery.   HOW TO MANAGE YOUR DIABETES BEFORE AND AFTER SURGERY  Why is it important to control my blood sugar before and after surgery? Improving blood sugar levels before and after surgery helps healing and can limit problems. A way of improving blood sugar control is eating a healthy diet by:  Eating less sugar and carbohydrates  Increasing activity/exercise  Talking with your doctor about reaching your blood sugar goals High blood sugars (greater than 180 mg/dL) can raise your risk of infections and slow your recovery, so you will need to focus on controlling your diabetes during the weeks before surgery. Make sure that the doctor who takes care of your diabetes knows about your planned surgery including the date and location.  How do I manage my blood sugar before surgery? Check your blood sugar at least 4 times a day, starting 2 days before surgery,  to make sure that the level is not too high or low.  Check your blood sugar the morning of your surgery when you wake up and every 2 hours until you get to the Short Stay unit.  If your blood sugar is less than 70 mg/dL, you will need to treat for low blood sugar: Do not take insulin. Treat a low blood sugar (less than 70 mg/dL) with  cup of clear juice (cranberry or apple), 4 glucose tablets, OR glucose gel. Recheck blood sugar in 15 minutes after treatment (to make sure it is greater than 70 mg/dL). If your blood sugar is not greater than 70 mg/dL on recheck, call [phone] for further instructions. Report your blood sugar to the short stay nurse when you get to Short Stay.  If you are admitted to the hospital after surgery: Your blood sugar will be checked by the staff and you will probably be given insulin after surgery (instead of oral diabetes medicines) to make sure you have good blood sugar levels. The goal for blood sugar control after surgery is 80-180 mg/dL.             Do NOT Smoke (Tobacco/Vaping) for 24 hours prior to your procedure.  If you use a CPAP at night, you may bring your mask/headgear for your overnight stay.   Contacts, glasses, piercing's, hearing aid's, dentures or partials may not be worn into surgery, please bring cases for these belongings.  For patients admitted to the hospital, discharge time will be determined by your treatment team.   Patients discharged the day of surgery will not be allowed to drive home, and someone needs to stay with them for 24 hours.  SURGICAL WAITING ROOM VISITATION Patients having surgery or a procedure may have no more than 2 support people in the waiting area - these visitors may rotate.   Children under the age of 5 must have an adult with them who is not the patient. If the patient needs to stay at the hospital during part of their recovery, the visitor guidelines for inpatient rooms apply. Pre-op nurse will coordinate  an appropriate time for 1 support person to accompany patient in pre-op.  This support person may not rotate.   Please refer to the [facility] website for the visitor guidelines for Inpatients (after your surgery is over and you are in a regular room).    Special instructions:   [place]- Preparing For Surgery  Before surgery, you can play an important role. Because skin is not sterile, your skin needs to be as free of germs as possible. You can reduce the number of germs on your skin by washing with CHG (chlorahexidine gluconate) Soap before surgery.  CHG is an antiseptic cleaner which kills germs and bonds with the skin to continue killing germs even after washing.    Oral Hygiene is also important to reduce your risk of infection.  Remember - BRUSH YOUR TEETH THE MORNING OF SURGERY WITH YOUR REGULAR TOOTHPASTE  Please do not use if you have an allergy to CHG or antibacterial soaps. If your skin becomes reddened/irritated stop using the CHG.  Do not shave (including legs and underarms) for at least 48 hours prior to first CHG shower. It is OK to shave your face.  Please follow these instructions carefully.   Shower the NIGHT BEFORE SURGERY and the MORNING OF SURGERY  If you chose to wash your hair, wash your hair first as usual with your normal shampoo.  After you shampoo, rinse your hair and body thoroughly to remove the shampoo.  Use CHG Soap as you would any other liquid soap. You can apply CHG directly to the skin and wash gently with a scrungie or a clean washcloth.   Apply the CHG Soap to your body ONLY FROM THE NECK DOWN.  Do not use on open wounds or open sores. Avoid contact with your eyes, ears, mouth and genitals (private parts). Wash Face and genitals (private parts)  with your normal soap.   Wash thoroughly, paying special attention to the area where your surgery will be performed.  Thoroughly rinse your body with warm water from the neck down.  DO NOT shower/wash  with your normal soap after using and rinsing off the CHG Soap.  Pat yourself dry with a CLEAN TOWEL.  Wear CLEAN PAJAMAS to bed the night before surgery  Place CLEAN SHEETS on your bed the night before your surgery  DO NOT SLEEP WITH PETS.   Day of Surgery: Take a shower with CHG soap. Do not wear jewelry or makeup Do not wear lotions, powders, perfumes/colognes, or deodorant. Do not shave 48 hours prior to surgery.  Men may shave face and neck. Do not bring valuables to the hospital.  [facility] is not responsible for any belongings or valuables. Do not wear nail polish, gel polish, artificial nails, or any other type of covering on natural nails (fingers and toes) If you have  artificial nails or gel coating that need to be removed by a nail salon, please have this removed prior to surgery. Artificial nails or gel coating may interfere with anesthesia's ability to adequately monitor your vital signs.  Wear Clean/Comfortable clothing the morning of surgery Remember to brush your teeth WITH YOUR REGULAR TOOTHPASTE.   Please read over the following fact sheets that you were given.    If you received a COVID test during your pre-op visit  it is requested that you wear a mask when out in public, stay away from anyone that may not be feeling well and notify your surgeon if you develop symptoms. If you have been in contact with anyone that has tested positive in the last 10 days please notify you surgeon.

## 2023-01-30 ENCOUNTER — Encounter (HOSPITAL_COMMUNITY): Payer: Self-pay

## 2023-01-30 ENCOUNTER — Other Ambulatory Visit: Payer: Self-pay

## 2023-01-30 ENCOUNTER — Encounter (HOSPITAL_COMMUNITY)
Admission: RE | Admit: 2023-01-30 | Discharge: 2023-01-30 | Disposition: A | Discharge status: Home / Self Care | Payer: Medicare PPO | Source: Ambulatory Visit | Attending: Neurological Surgery | Admitting: Neurological Surgery

## 2023-01-30 VITALS — BP 133/60 | HR 88 | Temp 98.2°F | Resp 17 | Ht 66.0 in | Wt 222.0 lb

## 2023-01-30 DIAGNOSIS — E119 Type 2 diabetes mellitus without complications: Secondary | ICD-10-CM

## 2023-01-30 DIAGNOSIS — N189 Chronic kidney disease, unspecified: Secondary | ICD-10-CM | POA: Insufficient documentation

## 2023-01-30 DIAGNOSIS — Z79899 Other long term (current) drug therapy: Secondary | ICD-10-CM | POA: Diagnosis not present

## 2023-01-30 DIAGNOSIS — M4310 Spondylolisthesis, site unspecified: Secondary | ICD-10-CM | POA: Insufficient documentation

## 2023-01-30 DIAGNOSIS — E1122 Type 2 diabetes mellitus with diabetic chronic kidney disease: Secondary | ICD-10-CM | POA: Insufficient documentation

## 2023-01-30 DIAGNOSIS — Z6835 Body mass index (BMI) 35.0-35.9, adult: Secondary | ICD-10-CM | POA: Insufficient documentation

## 2023-01-30 DIAGNOSIS — Z7985 Long-term (current) use of injectable non-insulin antidiabetic drugs: Secondary | ICD-10-CM | POA: Diagnosis not present

## 2023-01-30 DIAGNOSIS — E669 Obesity, unspecified: Secondary | ICD-10-CM | POA: Diagnosis not present

## 2023-01-30 DIAGNOSIS — Z794 Long term (current) use of insulin: Secondary | ICD-10-CM | POA: Insufficient documentation

## 2023-01-30 DIAGNOSIS — Z01818 Encounter for other preprocedural examination: Secondary | ICD-10-CM | POA: Diagnosis not present

## 2023-01-30 DIAGNOSIS — I129 Hypertensive chronic kidney disease with stage 1 through stage 4 chronic kidney disease, or unspecified chronic kidney disease: Secondary | ICD-10-CM | POA: Diagnosis not present

## 2023-01-30 HISTORY — DX: Unspecified osteoarthritis, unspecified site: M19.90

## 2023-01-30 HISTORY — DX: Essential (primary) hypertension: I10

## 2023-01-30 LAB — CBC
HCT: 37.8 % (ref 36.0–46.0)
Hemoglobin: 12.9 g/dL (ref 12.0–15.0)
MCH: 32.6 pg (ref 26.0–34.0)
MCHC: 34.1 g/dL (ref 30.0–36.0)
MCV: 95.5 fL (ref 80.0–100.0)
Platelets: 261 10*3/uL (ref 150–400)
RBC: 3.96 MIL/uL (ref 3.87–5.11)
RDW: 12.5 % (ref 11.5–15.5)
WBC: 7 10*3/uL (ref 4.0–10.5)
nRBC: 0 % (ref 0.0–0.2)

## 2023-01-30 LAB — HEMOGLOBIN A1C
Hgb A1c MFr Bld: 6.4 % — ABNORMAL HIGH (ref 4.8–5.6)
Mean Plasma Glucose: 136.98 mg/dL

## 2023-01-30 LAB — SURGICAL PCR SCREEN
MRSA, PCR: NEGATIVE
Staphylococcus aureus: NEGATIVE

## 2023-01-30 LAB — BASIC METABOLIC PANEL
Anion gap: 11 (ref 5–15)
BUN: 11 mg/dL (ref 8–23)
CO2: 23 mmol/L (ref 22–32)
Calcium: 9.8 mg/dL (ref 8.9–10.3)
Chloride: 102 mmol/L (ref 98–111)
Creatinine, Ser: 1.16 mg/dL — ABNORMAL HIGH (ref 0.44–1.00)
GFR, Estimated: 52 mL/min — ABNORMAL LOW (ref >60)
Glucose, Bld: 162 mg/dL — ABNORMAL HIGH (ref 70–99)
Potassium: 4.6 mmol/L (ref 3.5–5.1)
Sodium: 136 mmol/L (ref 135–145)

## 2023-01-30 LAB — TYPE AND SCREEN
ABO/RH(D): A NEG
Antibody Screen: NEGATIVE

## 2023-01-30 LAB — PROTIME-INR
INR: 1 (ref 0.8–1.2)
Prothrombin Time: 13.2 seconds (ref 11.4–15.2)

## 2023-01-30 LAB — GLUCOSE, CAPILLARY: Glucose-Capillary: 160 mg/dL — ABNORMAL HIGH (ref 70–99)

## 2023-01-30 NOTE — Progress Notes (Signed)
Anesthesia APP Evaluation:  Case: [id number] Date/Time: 02/07/23 0946   Procedures:      PLIF - L4-L5 - (Back)     Decompressive Laminectomy - L2-L3 - L3-L4 - bilateral (Bilateral: Back)   Anesthesia type: General   Pre-op diagnosis: Spondylolisthesis   Location: [facility] OR ROOM 18 / [place] OR   Surgeons: Eustace Moore, MD       DISCUSSION: Patient is a 67 year old female scheduled for the above procedures.  History includes never smoker, HTN, DM2, CKD, murmur (02/2003), recurrent syncope (03/01/03, 04/03/03 in pre-op followed by cardiac w/u, 11/31/16 followed by neurology work-up; still prone to syncope with triggers like anxiety, over-heated, needle exposure), open nasal fracture (falling syncope with fall, s/p closed reduction, nasal septal reconstruction 03/30/03). BMI is consistent with obesity.  I evaluated her at PAT given history of recurrent syncope and previous mention of murmur.  She has had episodes of recurrent syncope for over 20 years.  Based on hospital and ENT records by Jerrell Belfast, MD from 03/30/2003, she had a syncopal episode on 03/01/2003 and sustained a nasal fracture.  She was scheduled for surgery at [facility] on 03/24/2003 but upon transport to the OR she had a second syncopal episode.  Surgery was canceled and she was referred to cardiologist Rozann Lesches, MD for additional evaluation workup. Dr. Wilburn Cornelia wrote, "it was felt that this represented neurogenic or vasovagal syncope.  There was no evidence of cardiac abnormality or hypoglycemia, and the patient was cleared for general anesthesia for her surgical procedure." She believes she had an echocardiogram then or when she had a screening echo after dexfenfluramine was recalled. She had another episode of syncope around 11/15/15 and was referred to neurologist Margette Fast, MD. Brain MRI on 12/28/15 showed minimal periventricular and subcortical foci of nonspecific gliosis with no acute findings. Her 01/24/16 EEG was normal. He  recommended as needed follow-up.   She receives routine regular primary care by Manon Hilding, MD at [facility] in [place]. She reported last visit was in January. She continues to have periodic syncope, typically about once every 2-3 years. Anxiety, getting hot, or exposure to needles may be a triggering factor. She will typically start feeling warm beforehand and will try to lie down. Her last episode was in January while riding in the car with her husband just a few hours after getting her COVID vaccine. Her husband pulled over and she responded within a minute. There was no seizure like activity. She and her husband feel that her recurrent syncope has been "typical" for her for years now. She had prior cardiology and neurology work-ups for this. She denied chest pain, SOB, palpitations. She denied edema but has known large LE. She was consistently getting 10K steps in until her back and leg pain about a year ago. Heart RRR. I did not appreciate a definite murmur on exam. Lung clear. No pitting edema. No carotid bruits.   Discussed long-term recurrent syncope history with anesthesiologist Oleta Mouse, MD. She had previously reassuring work-up, and she and husband denied any significant changes in pattern or triggers. Anesthesia team to evaluate on the day of surgery. She was instructed to hold Ozempic one week prior to surgery.    VS: BP 133/60   Pulse 88   Temp 36.8 C   Resp 17   Ht 5' 6"$  (1.676 m)   Wt 100.7 kg   SpO2 98%   BMI 35.83 kg/m    PROVIDERS: Manon Hilding,  MD is PCP    LABS: Labs reviewed: Acceptable for surgery. (all labs ordered are listed, but only abnormal results are displayed)  Labs Reviewed  GLUCOSE, CAPILLARY - Abnormal; Notable for the following components:      Result Value   Glucose-Capillary 160 (*)    All other components within normal limits  HEMOGLOBIN A1C - Abnormal; Notable for the following components:   Hgb A1c MFr Bld 6.4 (*)     All other components within normal limits  BASIC METABOLIC PANEL - Abnormal; Notable for the following components:   Glucose, Bld 162 (*)    Creatinine, Ser 1.16 (*)    GFR, Estimated 52 (*)    All other components within normal limits  SURGICAL PCR SCREEN  PROTIME-INR  CBC  TYPE AND SCREEN    IMAGES: MRI L-spine 01/09/23 (Canopy/PACS): IMPRESSION: 1. No acute findings or clear explanation for the patient's symptoms. Multilevel spondylosis superimposed on a congenitally small spinal canal. 2. Mild to moderate multifactorial spinal stenosis at L2-3 with asymmetric moderate narrowing of the right lateral recess and right foramen. 3. Moderate multifactorial spinal stenosis at L3-4 with mild lateral recess and foraminal narrowing, left greater than right. 4. Degenerative anterolisthesis and moderate multifactorial spinal stenosis at L4-5 with moderate narrowing of the lateral recesses and foramina, left greater than right. 5. Chronic degenerative disc disease at L5-S1 with resulting mild to moderate right and moderate to severe left foraminal narrowing.   MRI brain 12/28/15: IMPRESSION:   Equivocal MRI brain (without) demonstrating: 1. Minimal periventricular and subcortical foci of non-specific gliosis. 2. No acute findings.   EKG: 01/30/23: Normal sinus rhythm Cannot rule out Anterior infarct , age undetermined   CV: 30 day 01/29/16: 30 day event recorder reviewed. Ordered by Dr. Margette Fast. Sinus rhythm was noted throughout the recording with a rare PAC. No atrial fibrillation documented. No pauses.    Past Medical History:  Diagnosis Date   Arthritis    Chronic renal insufficiency    Diabetes mellitus    Type 2   Fainting    on occasion when she see needles   Hypertension    Obesity    Syncope and collapse 12/22/2015    Past Surgical History:  Procedure Laterality Date   BREAST BIOPSY  2004   results negative   CATARACT EXTRACTION Bilateral     CESAREAN SECTION  12/16/1986   cesect     COLONOSCOPY  06/08/2012   Procedure: COLONOSCOPY;  Surgeon: Daneil Dolin, MD;  Location: [facility];  Service: Endoscopy;  Laterality: N/A;  10:45 AM   EXPLORATORY LAPAROTOMY  12/17/1995   SEPTOPLASTY  12/16/2002   TUBAL LIGATION  1997    MEDICATIONS:  Cholecalciferol (VITAMIN D3) 50 MCG (2000 UT) CAPS   lisinopril (PRINIVIL,ZESTRIL) 20 MG tablet   metFORMIN (GLUCOPHAGE-XR) 500 MG 24 hr tablet   Multiple Vitamin (MULTIVITAMIN) tablet   OZEMPIC, 1 MG/DOSE, 4 MG/3ML SOPN   No current facility-administered medications for this encounter.    Myra Gianotti, PA-C Surgical Short Stay/Anesthesiology [facility] Phone [phone] [facility] Phone [phone] 01/30/2023 6:28 PM

## 2023-01-30 NOTE — Anesthesia Preprocedure Evaluation (Addendum)
Anesthesia Evaluation  Patient identified by MRN, date of birth, ID band Patient awake    Reviewed: Allergy & Precautions, H&P , NPO status , Patient's Chart, lab work & pertinent test results  Airway Mallampati: II  TM Distance: >3 FB Neck ROM: Full    Dental no notable dental hx. (+) Teeth Intact, Dental Advisory Given   Pulmonary neg pulmonary ROS   Pulmonary exam normal breath sounds clear to auscultation       Cardiovascular Exercise Tolerance: Good hypertension, negative cardio ROS Normal cardiovascular exam Rhythm:Regular Rate:Normal     Neuro/Psych negative neurological ROS  negative psych ROS   GI/Hepatic negative GI ROS, Neg liver ROS,,,  Endo/Other  negative endocrine ROSdiabetes    Renal/GU Renal diseasenegative Renal ROS  negative genitourinary   Musculoskeletal negative musculoskeletal ROS (+) Arthritis ,    Abdominal   Peds negative pediatric ROS (+)  Hematology negative hematology ROS (+)   Anesthesia Other Findings   Reproductive/Obstetrics negative OB ROS                             Anesthesia Physical Anesthesia Plan  ASA: 3  Anesthesia Plan: General   Post-op Pain Management: Ofirmev IV (intra-op)* and Dilaudid IV   Induction: Intravenous  PONV Risk Score and Plan: 3 and Ondansetron, Dexamethasone and Treatment may vary due to age or medical condition  Airway Management Planned: Oral ETT  Additional Equipment: None  Intra-op Plan:   Post-operative Plan: Extubation in OR  Informed Consent: I have reviewed the patients History and Physical, chart, labs and discussed the procedure including the risks, benefits and alternatives for the proposed anesthesia with the patient or authorized representative who has indicated his/her understanding and acceptance.       Plan Discussed with: Anesthesiologist and CRNA  Anesthesia Plan Comments: (PAT note written  01/30/2023 by Myra Gianotti, PA-C.   DISCUSSION: Patient is a 67 year old female scheduled for the above procedures.   History includes never smoker, HTN, DM2, CKD, murmur (02/2003), recurrent syncope (03/01/03, 04/03/03 in pre-op followed by cardiac w/u, 11/31/16 followed by neurology work-up; still prone to syncope with triggers like anxiety, over-heated, needle exposure), open nasal fracture (falling syncope with fall, s/p closed reduction, nasal septal reconstruction 03/30/03). BMI is consistent with obesity.   I evaluated her at PAT given history of recurrent syncope and previous mention of murmur.  She has had episodes of recurrent syncope for over 20 years.  Based on hospital and ENT records by Jerrell Belfast, MD from 03/30/2003, she had a syncopal episode on 03/01/2003 and sustained a nasal fracture.  She was scheduled for surgery at [facility] on 03/24/2003 but upon transport to the OR she had a second syncopal episode.  Surgery was canceled and she was referred to cardiologist Rozann Lesches, MD for additional evaluation workup. Dr. Wilburn Cornelia wrote, "it was felt that this represented neurogenic or vasovagal syncope.  There was no evidence of cardiac abnormality or hypoglycemia, and the patient was cleared for general anesthesia for her surgical procedure." She believes she had an echocardiogram then or when she had a screening echo after dexfenfluramine was recalled. She had another episode of syncope around 11/15/15 and was referred to neurologist Margette Fast, MD. Brain MRI on 12/28/15 showed minimal periventricular and subcortical foci of nonspecific gliosis with no acute findings. Her 01/24/16 EEG was normal. He recommended as needed follow-up.    She receives routine regular primary care by [employer],  Silvestre Moment, MD at [facility] in [place]. She reported last visit was in January. She continues to have periodic syncope, typically about once every 2-3 years. Anxiety, getting hot, or exposure to  needles may be a triggering factor. She will typically start feeling warm beforehand and will try to lie down. Her last episode was in January while riding in the car with her husband just a few hours after getting her COVID vaccine. Her husband pulled over and she responded within a minute. There was no seizure like activity. She and her husband feel that her recurrent syncope has been "typical" for her for years now. She had prior cardiology and neurology work-ups for this. She denied chest pain, SOB, palpitations. She denied edema but has known large LE. She was consistently getting 10K steps in until her back and leg pain about a year ago. Heart RRR. I did not appreciate a definite murmur on exam. Lung clear. No pitting edema. No carotid bruits.    Discussed long-term recurrent syncope history with anesthesiologist Oleta Mouse, MD. She had previously reassuring work-up, and she and husband denied any significant changes in pattern or triggers. Anesthesia team to evaluate on the day of surgery. She was instructed to hold Ozempic one week prior to surgery.    )       Anesthesia Quick Evaluation

## 2023-01-30 NOTE — Progress Notes (Addendum)
PCP - Dr. Consuello Masse Cardiologist - Denies  PPM/ICD - Denies Device Orders - n/a Rep Notified - n/a  Chest x-ray - n/a EKG - 01/30/2023 Stress Test - Denies ECHO - Denies Cardiac Cath - Denies  Sleep Study - Denies CPAP - n/a  Pt is DM2. She takes her blood sugar 1 time/week. Normal fasting blood sugar is 100-110. CBG at pre-op appointment 160. For lunch she had a banana sandwich.  Last dose of GLP1 agonist- Ozempic 01/30/23 GLP1 instructions: Pt instructed to not take her dose 2/22, the day prior to surgery  Blood Thinner Instructions: n/a Aspirin Instructions: n/a  NPO after midnight  COVID TEST- n/a   Anesthesia review: Yes. Abnormal EKG. Myra Gianotti, PA-C also saw pt during PAT appointment.  Patient denies shortness of breath, fever, cough and chest pain at PAT appointment   All instructions explained to the patient, with a verbal understanding of the material. Patient agrees to go over the instructions while at home for a better understanding. Patient also instructed to self quarantine after being tested for COVID-19. The opportunity to ask questions was provided.

## 2023-02-04 DIAGNOSIS — M4316 Spondylolisthesis, lumbar region: Secondary | ICD-10-CM | POA: Diagnosis not present

## 2023-02-07 ENCOUNTER — Ambulatory Visit (HOSPITAL_BASED_OUTPATIENT_CLINIC_OR_DEPARTMENT_OTHER): Payer: Medicare PPO | Admitting: Anesthesiology

## 2023-02-07 ENCOUNTER — Encounter (HOSPITAL_COMMUNITY)
Admission: RE | Disposition: A | Discharge status: Home / Self Care | Payer: Self-pay | Source: Home / Self Care | Attending: Neurological Surgery

## 2023-02-07 ENCOUNTER — Ambulatory Visit (HOSPITAL_COMMUNITY): Payer: Medicare PPO

## 2023-02-07 ENCOUNTER — Ambulatory Visit (HOSPITAL_COMMUNITY): Payer: Medicare PPO | Admitting: Vascular Surgery

## 2023-02-07 ENCOUNTER — Encounter (HOSPITAL_COMMUNITY): Payer: Self-pay | Admitting: Neurological Surgery

## 2023-02-07 ENCOUNTER — Observation Stay (HOSPITAL_COMMUNITY)
Admission: RE | Admit: 2023-02-07 | Discharge: 2023-02-08 | Disposition: A | Discharge status: Home / Self Care | Payer: Medicare PPO | Attending: Neurological Surgery | Admitting: Neurological Surgery

## 2023-02-07 ENCOUNTER — Other Ambulatory Visit: Payer: Self-pay

## 2023-02-07 DIAGNOSIS — M4316 Spondylolisthesis, lumbar region: Secondary | ICD-10-CM

## 2023-02-07 DIAGNOSIS — M48061 Spinal stenosis, lumbar region without neurogenic claudication: Secondary | ICD-10-CM | POA: Insufficient documentation

## 2023-02-07 DIAGNOSIS — Z79899 Other long term (current) drug therapy: Secondary | ICD-10-CM | POA: Diagnosis not present

## 2023-02-07 DIAGNOSIS — Z981 Arthrodesis status: Secondary | ICD-10-CM | POA: Diagnosis not present

## 2023-02-07 DIAGNOSIS — M1990 Unspecified osteoarthritis, unspecified site: Secondary | ICD-10-CM | POA: Diagnosis not present

## 2023-02-07 DIAGNOSIS — Z7984 Long term (current) use of oral hypoglycemic drugs: Secondary | ICD-10-CM | POA: Insufficient documentation

## 2023-02-07 DIAGNOSIS — E119 Type 2 diabetes mellitus without complications: Secondary | ICD-10-CM | POA: Diagnosis not present

## 2023-02-07 DIAGNOSIS — N189 Chronic kidney disease, unspecified: Secondary | ICD-10-CM | POA: Diagnosis not present

## 2023-02-07 DIAGNOSIS — M5416 Radiculopathy, lumbar region: Secondary | ICD-10-CM | POA: Insufficient documentation

## 2023-02-07 DIAGNOSIS — I129 Hypertensive chronic kidney disease with stage 1 through stage 4 chronic kidney disease, or unspecified chronic kidney disease: Secondary | ICD-10-CM

## 2023-02-07 DIAGNOSIS — E1122 Type 2 diabetes mellitus with diabetic chronic kidney disease: Secondary | ICD-10-CM

## 2023-02-07 DIAGNOSIS — I10 Essential (primary) hypertension: Secondary | ICD-10-CM | POA: Insufficient documentation

## 2023-02-07 DIAGNOSIS — M532X6 Spinal instabilities, lumbar region: Secondary | ICD-10-CM | POA: Diagnosis not present

## 2023-02-07 LAB — GLUCOSE, CAPILLARY
Glucose-Capillary: 120 mg/dL — ABNORMAL HIGH (ref 70–99)
Glucose-Capillary: 114 mg/dL — ABNORMAL HIGH (ref 70–99)
Glucose-Capillary: 149 mg/dL — ABNORMAL HIGH (ref 70–99)
Glucose-Capillary: 191 mg/dL — ABNORMAL HIGH (ref 70–99)
Glucose-Capillary: 218 mg/dL — ABNORMAL HIGH (ref 70–99)

## 2023-02-07 LAB — ABO/RH: ABO/RH(D): A NEG

## 2023-02-07 SURGERY — POSTERIOR LUMBAR FUSION 1 LEVEL
Anesthesia: General | Site: Back

## 2023-02-07 MED ORDER — ONDANSETRON HCL 4 MG/2ML IJ SOLN
INTRAMUSCULAR | Status: DC | PRN
  Administered 2023-02-07: 4 mg via INTRAVENOUS

## 2023-02-07 MED ORDER — FENTANYL CITRATE (PF) 250 MCG/5ML IJ SOLN
INTRAMUSCULAR | Status: AC
  Filled 2023-02-07: qty 5

## 2023-02-07 MED ORDER — CHLORHEXIDINE GLUCONATE 0.12 % MT SOLN
15.0000 mL | Freq: Once | OROMUCOSAL | Status: AC
  Administered 2023-02-07: 15 mL via OROMUCOSAL
  Filled 2023-02-07: qty 15

## 2023-02-07 MED ORDER — SODIUM CHLORIDE 0.9% FLUSH: 3.0000 mL | INTRAVENOUS | Status: DC | PRN

## 2023-02-07 MED ORDER — ONDANSETRON HCL 4 MG/2ML IJ SOLN: 4.0000 mg | Freq: Once | INTRAMUSCULAR | Status: DC | PRN

## 2023-02-07 MED ORDER — SENNA 8.6 MG PO TABS
1.0000 | ORAL_TABLET | Freq: Two times a day (BID) | ORAL | Status: DC
  Administered 2023-02-07: 8.6 mg via ORAL
  Filled 2023-02-07: qty 1

## 2023-02-07 MED ORDER — FENTANYL CITRATE (PF) 100 MCG/2ML IJ SOLN
INTRAMUSCULAR | Status: AC
  Administered 2023-02-07: 25 ug via INTRAVENOUS
  Filled 2023-02-07: qty 2

## 2023-02-07 MED ORDER — ONDANSETRON HCL 4 MG/2ML IJ SOLN: 4.0000 mg | Freq: Four times a day (QID) | INTRAMUSCULAR | Status: DC | PRN

## 2023-02-07 MED ORDER — FENTANYL CITRATE (PF) 250 MCG/5ML IJ SOLN
INTRAMUSCULAR | Status: DC | PRN
  Administered 2023-02-07: 100 ug via INTRAVENOUS
  Administered 2023-02-07: 50 ug via INTRAVENOUS

## 2023-02-07 MED ORDER — MIDAZOLAM HCL 2 MG/2ML IJ SOLN
INTRAMUSCULAR | Status: DC | PRN
  Administered 2023-02-07: 2 mg via INTRAVENOUS

## 2023-02-07 MED ORDER — OXYCODONE HCL 5 MG PO TABS
5.0000 mg | ORAL_TABLET | ORAL | Status: DC | PRN
  Administered 2023-02-07: 5 mg via ORAL
  Filled 2023-02-07 (×2): qty 1

## 2023-02-07 MED ORDER — SUGAMMADEX SODIUM 200 MG/2ML IV SOLN
INTRAVENOUS | Status: DC | PRN
  Administered 2023-02-07: 200 mg via INTRAVENOUS

## 2023-02-07 MED ORDER — INSULIN ASPART 100 UNIT/ML IJ SOLN
0.0000 [IU] | Freq: Three times a day (TID) | INTRAMUSCULAR | Status: DC
  Administered 2023-02-08: 2 [IU] via SUBCUTANEOUS

## 2023-02-07 MED ORDER — LACTATED RINGERS IV SOLN: INTRAVENOUS | Status: DC

## 2023-02-07 MED ORDER — GABAPENTIN 300 MG PO CAPS
300.0000 mg | ORAL_CAPSULE | ORAL | Status: AC
  Administered 2023-02-07: 300 mg via ORAL
  Filled 2023-02-07: qty 1

## 2023-02-07 MED ORDER — EPHEDRINE 5 MG/ML INJ
INTRAVENOUS | Status: AC
  Filled 2023-02-07: qty 5

## 2023-02-07 MED ORDER — ROCURONIUM BROMIDE 10 MG/ML (PF) SYRINGE
PREFILLED_SYRINGE | INTRAVENOUS | Status: AC
  Filled 2023-02-07: qty 10

## 2023-02-07 MED ORDER — ACETAMINOPHEN 500 MG PO TABS
1000.0000 mg | ORAL_TABLET | Freq: Four times a day (QID) | ORAL | Status: DC
  Administered 2023-02-07 – 2023-02-08 (×3): 1000 mg via ORAL
  Filled 2023-02-07 (×3): qty 2

## 2023-02-07 MED ORDER — PHENOL 1.4 % MT LIQD: 1.0000 | OROMUCOSAL | Status: DC | PRN

## 2023-02-07 MED ORDER — BUPIVACAINE HCL (PF) 0.25 % IJ SOLN
INTRAMUSCULAR | Status: AC
  Filled 2023-02-07: qty 30

## 2023-02-07 MED ORDER — EPHEDRINE SULFATE-NACL 50-0.9 MG/10ML-% IV SOSY
PREFILLED_SYRINGE | INTRAVENOUS | Status: DC | PRN
  Administered 2023-02-07 (×5): 5 mg via INTRAVENOUS

## 2023-02-07 MED ORDER — THROMBIN 5000 UNITS EX SOLR: OROMUCOSAL | Status: DC | PRN

## 2023-02-07 MED ORDER — CELECOXIB 200 MG PO CAPS
200.0000 mg | ORAL_CAPSULE | Freq: Two times a day (BID) | ORAL | Status: DC
  Administered 2023-02-07: 200 mg via ORAL
  Filled 2023-02-07: qty 1

## 2023-02-07 MED ORDER — CEFAZOLIN SODIUM-DEXTROSE 2-4 GM/100ML-% IV SOLN
2.0000 g | Freq: Three times a day (TID) | INTRAVENOUS | Status: AC
  Administered 2023-02-07 – 2023-02-08 (×2): 2 g via INTRAVENOUS
  Filled 2023-02-07 (×2): qty 100

## 2023-02-07 MED ORDER — LIDOCAINE 2% (20 MG/ML) 5 ML SYRINGE
INTRAMUSCULAR | Status: DC | PRN
  Administered 2023-02-07: 100 mg via INTRAVENOUS

## 2023-02-07 MED ORDER — MIDAZOLAM HCL 2 MG/2ML IJ SOLN
INTRAMUSCULAR | Status: AC
  Filled 2023-02-07: qty 2

## 2023-02-07 MED ORDER — MEPERIDINE HCL 25 MG/ML IJ SOLN: 6.2500 mg | INTRAMUSCULAR | Status: DC | PRN

## 2023-02-07 MED ORDER — ONDANSETRON HCL 4 MG/2ML IJ SOLN
INTRAMUSCULAR | Status: AC
  Filled 2023-02-07: qty 2

## 2023-02-07 MED ORDER — OXYCODONE HCL 5 MG PO TABS
5.0000 mg | ORAL_TABLET | ORAL | Status: DC | PRN
  Administered 2023-02-08 (×3): 5 mg via ORAL
  Filled 2023-02-07 (×3): qty 1

## 2023-02-07 MED ORDER — SODIUM CHLORIDE 0.9 % IV SOLN: 250.0000 mL | INTRAVENOUS | Status: DC

## 2023-02-07 MED ORDER — ACETAMINOPHEN 160 MG/5ML PO SOLN: 325.0000 mg | ORAL | Status: DC | PRN

## 2023-02-07 MED ORDER — ADULT MULTIVITAMIN W/MINERALS CH: 1.0000 | ORAL_TABLET | Freq: Every day | ORAL | Status: DC

## 2023-02-07 MED ORDER — 0.9 % SODIUM CHLORIDE (POUR BTL) OPTIME
TOPICAL | Status: DC | PRN
  Administered 2023-02-07: 1000 mL

## 2023-02-07 MED ORDER — PROPOFOL 10 MG/ML IV BOLUS
INTRAVENOUS | Status: AC
  Filled 2023-02-07: qty 20

## 2023-02-07 MED ORDER — OXYCODONE HCL 5 MG PO TABS: 5.0000 mg | ORAL_TABLET | Freq: Once | ORAL | Status: DC | PRN

## 2023-02-07 MED ORDER — METHOCARBAMOL 500 MG PO TABS: 500.0000 mg | ORAL_TABLET | Freq: Four times a day (QID) | ORAL | Status: DC | PRN

## 2023-02-07 MED ORDER — ONDANSETRON HCL 4 MG PO TABS: 4.0000 mg | ORAL_TABLET | Freq: Four times a day (QID) | ORAL | Status: DC | PRN

## 2023-02-07 MED ORDER — BUPIVACAINE HCL (PF) 0.25 % IJ SOLN
INTRAMUSCULAR | Status: DC | PRN
  Administered 2023-02-07: 10 mL

## 2023-02-07 MED ORDER — METHOCARBAMOL 1000 MG/10ML IJ SOLN: 500.0000 mg | Freq: Four times a day (QID) | INTRAVENOUS | Status: DC | PRN

## 2023-02-07 MED ORDER — ORAL CARE MOUTH RINSE: 15.0000 mL | Freq: Once | OROMUCOSAL | Status: AC

## 2023-02-07 MED ORDER — METFORMIN HCL ER 500 MG PO TB24
500.0000 mg | ORAL_TABLET | Freq: Two times a day (BID) | ORAL | Status: DC
  Administered 2023-02-07 – 2023-02-08 (×2): 500 mg via ORAL
  Filled 2023-02-07 (×2): qty 1

## 2023-02-07 MED ORDER — THROMBIN 20000 UNITS EX SOLR: CUTANEOUS | Status: DC | PRN

## 2023-02-07 MED ORDER — FENTANYL CITRATE (PF) 100 MCG/2ML IJ SOLN: 25.0000 ug | INTRAMUSCULAR | Status: DC | PRN

## 2023-02-07 MED ORDER — ACETAMINOPHEN 325 MG PO TABS: 325.0000 mg | ORAL_TABLET | ORAL | Status: DC | PRN

## 2023-02-07 MED ORDER — ACETAMINOPHEN 500 MG PO TABS
1000.0000 mg | ORAL_TABLET | ORAL | Status: AC
  Administered 2023-02-07: 1000 mg via ORAL
  Filled 2023-02-07: qty 2

## 2023-02-07 MED ORDER — SODIUM CHLORIDE 0.9% FLUSH: 3.0000 mL | Freq: Two times a day (BID) | INTRAVENOUS | Status: DC

## 2023-02-07 MED ORDER — PHENYLEPHRINE HCL-NACL 20-0.9 MG/250ML-% IV SOLN
INTRAVENOUS | Status: DC | PRN
  Administered 2023-02-07: 75 ug/min via INTRAVENOUS

## 2023-02-07 MED ORDER — DEXAMETHASONE SODIUM PHOSPHATE 10 MG/ML IJ SOLN
INTRAMUSCULAR | Status: AC
  Filled 2023-02-07: qty 1

## 2023-02-07 MED ORDER — MORPHINE SULFATE (PF) 2 MG/ML IV SOLN: 2.0000 mg | INTRAVENOUS | Status: DC | PRN

## 2023-02-07 MED ORDER — CEFAZOLIN SODIUM-DEXTROSE 2-4 GM/100ML-% IV SOLN
2.0000 g | INTRAVENOUS | Status: AC
  Administered 2023-02-07: 2 g via INTRAVENOUS
  Filled 2023-02-07: qty 100

## 2023-02-07 MED ORDER — MENTHOL 3 MG MT LOZG
1.0000 | LOZENGE | OROMUCOSAL | Status: DC | PRN
  Filled 2023-02-07: qty 9

## 2023-02-07 MED ORDER — POTASSIUM CHLORIDE IN NACL 20-0.9 MEQ/L-% IV SOLN
INTRAVENOUS | Status: DC
  Filled 2023-02-07: qty 1000

## 2023-02-07 MED ORDER — CHLORHEXIDINE GLUCONATE CLOTH 2 % EX PADS: 6.0000 | MEDICATED_PAD | Freq: Once | CUTANEOUS | Status: DC

## 2023-02-07 MED ORDER — THROMBIN 20000 UNITS EX SOLR
CUTANEOUS | Status: AC
  Filled 2023-02-07: qty 20000

## 2023-02-07 MED ORDER — OXYCODONE HCL 5 MG/5ML PO SOLN: 5.0000 mg | Freq: Once | ORAL | Status: DC | PRN

## 2023-02-07 MED ORDER — INSULIN ASPART 100 UNIT/ML IJ SOLN
0.0000 [IU] | Freq: Three times a day (TID) | INTRAMUSCULAR | Status: DC
  Administered 2023-02-07: 3 [IU] via SUBCUTANEOUS

## 2023-02-07 MED ORDER — ROCURONIUM BROMIDE 10 MG/ML (PF) SYRINGE
PREFILLED_SYRINGE | INTRAVENOUS | Status: DC | PRN
  Administered 2023-02-07: 80 mg via INTRAVENOUS
  Administered 2023-02-07 (×2): 20 mg via INTRAVENOUS

## 2023-02-07 MED ORDER — LIDOCAINE 2% (20 MG/ML) 5 ML SYRINGE
INTRAMUSCULAR | Status: AC
  Filled 2023-02-07: qty 5

## 2023-02-07 MED ORDER — PROPOFOL 10 MG/ML IV BOLUS
INTRAVENOUS | Status: DC | PRN
  Administered 2023-02-07: 30 mg via INTRAVENOUS
  Administered 2023-02-07: 120 mg via INTRAVENOUS

## 2023-02-07 MED ORDER — LISINOPRIL 20 MG PO TABS: 20.0000 mg | ORAL_TABLET | Freq: Every day | ORAL | Status: DC

## 2023-02-07 MED ORDER — DEXAMETHASONE SODIUM PHOSPHATE 10 MG/ML IJ SOLN
INTRAMUSCULAR | Status: DC | PRN
  Administered 2023-02-07: 10 mg via INTRAVENOUS

## 2023-02-07 MED ORDER — THROMBIN 5000 UNITS EX SOLR
CUTANEOUS | Status: AC
  Filled 2023-02-07: qty 5000

## 2023-02-07 MED ORDER — PHENYLEPHRINE 80 MCG/ML (10ML) SYRINGE FOR IV PUSH (FOR BLOOD PRESSURE SUPPORT)
PREFILLED_SYRINGE | INTRAVENOUS | Status: DC | PRN
  Administered 2023-02-07: 160 ug via INTRAVENOUS
  Administered 2023-02-07 (×5): 80 ug via INTRAVENOUS
  Administered 2023-02-07: 160 ug via INTRAVENOUS

## 2023-02-07 SURGICAL SUPPLY — 68 items
ADH SKN CLS APL DERMABOND .7 (GAUZE/BANDAGES/DRESSINGS) ×2
APL SKNCLS STERI-STRIP NONHPOA (GAUZE/BANDAGES/DRESSINGS) ×2
BAG COUNTER SPONGE SURGICOUNT (BAG) ×4 IMPLANT
BAG SPNG CNTER NS LX DISP (BAG) ×4
BAND INSRT 18 STRL LF DISP RB (MISCELLANEOUS) ×4
BAND RUBBER #18 3X1/16 STRL (MISCELLANEOUS) ×4 IMPLANT
BASKET BONE COLLECTION (BASKET) ×2 IMPLANT
BENZOIN TINCTURE PRP APPL 2/3 (GAUZE/BANDAGES/DRESSINGS) ×3 IMPLANT
BLADE BONE MILL MEDIUM (MISCELLANEOUS) ×2 IMPLANT
BLADE CLIPPER SURG (BLADE) IMPLANT
BUR CARBIDE MATCH 3.0 (BURR) ×4 IMPLANT
BUR NEURO DRILL SOFT 3.0X3.8M (BURR) ×1 IMPLANT
CANISTER SUCT 3000ML PPV (MISCELLANEOUS) ×4 IMPLANT
CNTNR URN SCR LID CUP LEK RST (MISCELLANEOUS) ×2 IMPLANT
CONT SPEC 4OZ STRL OR WHT (MISCELLANEOUS) ×2
COVER BACK TABLE 60X90IN (DRAPES) ×2 IMPLANT
DERMABOND ADVANCED .7 DNX12 (GAUZE/BANDAGES/DRESSINGS) ×2 IMPLANT
DRAPE C-ARM 42X72 X-RAY (DRAPES) ×4 IMPLANT
DRAPE LAPAROTOMY 100X72X124 (DRAPES) ×4 IMPLANT
DRAPE MICROSCOPE SLANT 54X150 (MISCELLANEOUS) ×2 IMPLANT
DRAPE SURG 17X23 STRL (DRAPES) ×4 IMPLANT
DRSG OPSITE POSTOP 4X6 (GAUZE/BANDAGES/DRESSINGS) ×1 IMPLANT
DURAPREP 26ML APPLICATOR (WOUND CARE) ×4 IMPLANT
ELECT BLADE 4.0 EZ CLEAN MEGAD (MISCELLANEOUS) ×2
ELECT REM PT RETURN 9FT ADLT (ELECTROSURGICAL) ×4
ELECTRODE BLDE 4.0 EZ CLN MEGD (MISCELLANEOUS) ×1 IMPLANT
ELECTRODE REM PT RTRN 9FT ADLT (ELECTROSURGICAL) ×4 IMPLANT
EVACUATOR 1/8 PVC DRAIN (DRAIN) ×2 IMPLANT
GAUZE 4X4 16PLY ~~LOC~~+RFID DBL (SPONGE) IMPLANT
GLOVE BIO SURGEON STRL SZ7 (GLOVE) IMPLANT
GLOVE BIO SURGEON STRL SZ8 (GLOVE) ×6 IMPLANT
GLOVE BIOGEL PI IND STRL 7.0 (GLOVE) IMPLANT
GOWN STRL REUS W/ TWL LRG LVL3 (GOWN DISPOSABLE) IMPLANT
GOWN STRL REUS W/ TWL XL LVL3 (GOWN DISPOSABLE) ×6 IMPLANT
GOWN STRL REUS W/TWL 2XL LVL3 (GOWN DISPOSABLE) IMPLANT
GOWN STRL REUS W/TWL LRG LVL3 (GOWN DISPOSABLE)
GOWN STRL REUS W/TWL XL LVL3 (GOWN DISPOSABLE) ×6
HEMOSTAT POWDER KIT SURGIFOAM (HEMOSTASIS) ×3 IMPLANT
KIT BASIN OR (CUSTOM PROCEDURE TRAY) ×4 IMPLANT
KIT GRAFTMAG DEL NEURO DISP (NEUROSURGERY SUPPLIES) IMPLANT
KIT TURNOVER KIT B (KITS) ×4 IMPLANT
MATRIX STRIP NEOCORE 12C (Putty) ×1 IMPLANT
MILL BONE PREP (MISCELLANEOUS) ×2 IMPLANT
NDL HYPO 25X1 1.5 SAFETY (NEEDLE) ×2 IMPLANT
NDL SPNL 20GX3.5 QUINCKE YW (NEEDLE) IMPLANT
NEEDLE HYPO 25X1 1.5 SAFETY (NEEDLE) ×4 IMPLANT
NEEDLE SPNL 20GX3.5 QUINCKE YW (NEEDLE) IMPLANT
NS IRRIG 1000ML POUR BTL (IV SOLUTION) ×4 IMPLANT
PACK LAMINECTOMY NEURO (CUSTOM PROCEDURE TRAY) ×3 IMPLANT
PAD ARMBOARD 7.5X6 YLW CONV (MISCELLANEOUS) ×12 IMPLANT
ROD LORD LIPPED TI 5.5X40 (Rod) ×2 IMPLANT
SCREW CANC SHANK MOD 6.5X45 (Screw) ×2 IMPLANT
SCREW KODIAK 6.5X45 (Screw) ×2 IMPLANT
SCREW POLYAXIAL TULIP (Screw) ×2 IMPLANT
SET SCREW (Screw) ×8 IMPLANT
SET SCREW SPNE (Screw) ×4 IMPLANT
SPACER IDENTITI 10X9X25 15D (Spacer) ×2 IMPLANT
SPONGE SURGIFOAM ABS GEL 100 (HEMOSTASIS) ×2 IMPLANT
SPONGE T-LAP 4X18 ~~LOC~~+RFID (SPONGE) IMPLANT
STRIP CLOSURE SKIN 1/2X4 (GAUZE/BANDAGES/DRESSINGS) ×5 IMPLANT
SUT VIC AB 0 CT1 18XCR BRD8 (SUTURE) ×3 IMPLANT
SUT VIC AB 0 CT1 8-18 (SUTURE) ×2
SUT VIC AB 2-0 CP2 18 (SUTURE) ×3 IMPLANT
SUT VIC AB 3-0 SH 8-18 (SUTURE) ×6 IMPLANT
TOWEL GREEN STERILE (TOWEL DISPOSABLE) ×4 IMPLANT
TOWEL GREEN STERILE FF (TOWEL DISPOSABLE) ×4 IMPLANT
TRAY FOLEY MTR SLVR 16FR STAT (SET/KITS/TRAYS/PACK) ×2 IMPLANT
WATER STERILE IRR 1000ML POUR (IV SOLUTION) ×4 IMPLANT

## 2023-02-07 NOTE — Op Note (Signed)
02/07/2023  1:50 PM  PATIENT:  Cindy Mcknight  67 y.o. female  PRE-OPERATIVE DIAGNOSIS: Dynamic spondylolisthesis L4-5 with severe spinal stenosis, spinal stenosis L2-L3 4, back pain with left lower extremity radiculopathy  POST-OPERATIVE DIAGNOSIS:  same  PROCEDURE:   1. Decompressive lumbar laminectomy, hemi facetectomy and foraminotomies L4-5 bilaterally requiring more work than would be required for a simple exposure of the disk for PLIF in order to adequately decompress the neural elements and address the spinal stenosis 2. Posterior lumbar interbody fusion L4-5 using PTI interbody cages packed with morcellized allograft and autograft  3. Posterior fixation L4-5 using ATEC cortical pedicle screws.  4. Intertransverse arthrodesis L4-5 using morcellized autograft and allograft. 5.  Left L2-3 L3-4 hemilaminectomy medial facetectomy and foraminotomies followed by sublaminar decompression, these levels are separate from the PLIF level  SURGEON:  Sherley Bounds, MD  ASSISTANTS: Glenford Peers, FNP  ANESTHESIA:  General  EBL: 150 ml  Total I/O In: 1100 [I.V.:1000; IV Piggyback:100] Out: 1150 [Urine:1000; Blood:150]  BLOOD ADMINISTERED:none  DRAINS: none   INDICATION FOR PROCEDURE: This patient presented with back pain and leg pain. Imaging revealed dynamic spondylolisthesis at L4-5 with severe spinal stenosis with moderate spinal stenosis at L2-3 and L3-4. The patient tried a reasonable attempt at conservative medical measures without relief. I recommended decompression and instrumented fusion to address the stenosis as well as the segmental  instability.  Patient understood the risks, benefits, and alternatives and potential outcomes and wished to proceed.  PROCEDURE DETAILS:  The patient was brought to the operating room. After induction of generalized endotracheal anesthesia the patient was rolled into the prone position on chest rolls and all pressure points were padded. The  patient's lumbar region was cleaned and then prepped with DuraPrep and draped in the usual sterile fashion. Anesthesia was injected and then a dorsal midline incision was made and carried down to the lumbosacral fascia. The fascia was opened and the paraspinous musculature was taken down in a subperiosteal fashion to expose L2-3 L3-4 and L4-5 bilaterally. A self-retaining retractor was placed. Intraoperative fluoroscopy confirmed my level, and I started with placement of the L4 cortical pedicle screws. The pedicle screw entry zones were identified utilizing surface landmarks and  AP and lateral fluoroscopy. I scored the cortex with the high-speed drill and then used the hand drill to drill an upward and outward direction into the pedicle. I then tapped line to line. I then placed a 6.5 x 45 mm cortical pedicle screw into the pedicles of L4 bilaterally.    I then turned my attention to the decompression and complete lumbar laminectomies, hemi- facetectomies, and foraminotomies were performed at L3-4 and L4-5.  My nurse practitioner was directly involved in the decompression and exposure of the neural elements. the patient had significant spinal stenosis and this required more work than would be required for a simple exposure of the disc for posterior lumbar interbody fusion which would only require a limited laminotomy. Much more generous decompression and generous foraminotomy was undertaken in order to adequately decompress the neural elements and address the patient's leg pain. The yellow ligament was removed to expose the underlying dura and nerve roots, and generous foraminotomies were performed to adequately decompress the neural elements. Both the exiting and traversing nerve roots were decompressed on both sides until a coronary dilator passed easily along the nerve roots. Once the decompression was complete, I turned my attention to the posterior lumbar interbody fusion. The epidural venous vasculature was  coagulated and cut  sharply. Disc space was incised and the initial discectomy was performed with pituitary rongeurs. The disc space was distracted with sequential distractors to a height of 10 mm. We then used a series of scrapers and shavers to prepare the endplates for fusion. The midline was prepared with Epstein curettes. Once the complete discectomy was finished, we packed an appropriate sized interbody cage with local autograft and morcellized allograft, gently retracted the nerve root, and tapped the cage into position at L4-5.  The midline between the cages was packed with morselized autograft and allograft.   At L2-3 and L3-4 we performed hemilaminectomy medial facetectomy and foraminotomies utilizing the high-speed drill and completed with the Kerrison punches.  The yellow ligament was opened and removed in a piecemeal fashion to expose the underlying dura and exiting nerve roots.  The lateral recess to fully decompress the roots.  I dissected until I was able to the pedicle.  I then drilled off under the spinous processes and performed a sublaminar decompression at each level removing the yellow ligament from the opposite lateral recess.  Then palpated with a coronary dilator to assure adequate decompression at both levels.  We then turned our attention to the placement of the lower pedicle screws. The pedicle screw entry zones were identified utilizing surface landmarks and fluoroscopy. I drilled into each pedicle utilizing the hand drill, and tapped each pedicle with the appropriate tap. We palpated with a ball probe to assure no break in the cortex. We then placed 6.5 x 45 mm pedicle screws into the pedicles bilaterally at L5.  My nurse practitioner assisted in placement of the pedicle screws.  We then decorticated the transverse processes and laid a mixture of morcellized autograft and allograft out over these to perform intertransverse arthrodesis at L4-5. We then placed lordotic rods into the  multiaxial screw heads of the pedicle screws and locked these in position with the locking caps and anti-torque device. We then checked our construct with AP and lateral fluoroscopy. Irrigated with copious amounts of bacitracin-containing saline solution. Inspected the nerve roots once again to assure adequate decompression, lined to the dura with Gelfoam,  and then we closed the muscle and the fascia with 0 Vicryl. Closed the subcutaneous tissues with 2-0 Vicryl and subcuticular tissues with 3-0 Vicryl. The skin was closed with benzoin and Steri-Strips. Dressing was then applied, the patient was awakened from general anesthesia and transported to the recovery room in stable condition. At the end of the procedure all sponge, needle and instrument counts were correct.   PLAN OF CARE: admit to inpatient  PATIENT DISPOSITION:  PACU - hemodynamically stable.   Delay start of Pharmacological VTE agent (>24hrs) due to surgical blood loss or risk of bleeding:  yes

## 2023-02-07 NOTE — Anesthesia Postprocedure Evaluation (Signed)
Anesthesia Post Note  Patient: NANNETTE HUN  Procedure(s) Performed: POSTERIOR LUMBAR INTERBODY FUSION - LUMBAR FOUR-LUMBAR FIVE WITH LUMBAR TWO-LUMBAR THREE, LUMBAR [place] FOUR DECOMPRESSIVE LAMINECTOMY (Back)     Patient location during evaluation: PACU Anesthesia Type: General Level of consciousness: awake and alert Pain management: pain level controlled Vital Signs Assessment: post-procedure vital signs reviewed and stable Respiratory status: spontaneous breathing, nonlabored ventilation, respiratory function stable and patient connected to nasal cannula oxygen Cardiovascular status: blood pressure returned to baseline and stable Postop Assessment: no apparent nausea or vomiting Anesthetic complications: no  No notable events documented.  Last Vitals:  Vitals:   02/07/23 0804  BP: 137/78  Pulse: 82  Resp: 18  Temp: 36.6 C  SpO2: 98%    Last Pain:  Vitals:   02/07/23 0826  TempSrc:   PainSc: 5                  Lori Liew

## 2023-02-07 NOTE — Transfer of Care (Signed)
Immediate Anesthesia Transfer of Care Note  Patient: Cindy Mcknight  Procedure(s) Performed: POSTERIOR LUMBAR INTERBODY FUSION - LUMBAR FOUR-LUMBAR FIVE WITH LUMBAR TWO-LUMBAR THREE, LUMBAR THREE-LUMBAR FOUR DECOMPRESSIVE LAMINECTOMY (Back)  Patient Location: PACU  Anesthesia Type:General  Level of Consciousness: awake, alert , oriented, patient cooperative, and responds to stimulation  Airway & Oxygen Therapy: Patient Spontanous Breathing and Patient connected to nasal cannula oxygen  Post-op Assessment: Report given to RN, Post -op Vital signs reviewed and stable, and Patient moving all extremities X 4  Post vital signs: Reviewed and stable  Last Vitals:  Vitals Value Taken Time  BP    Temp    Pulse 89 02/07/23 1407  Resp    SpO2 99 % 02/07/23 1407  Vitals shown include unvalidated device data.  Last Pain:  Vitals:   02/07/23 0826  TempSrc:   PainSc: 5          Complications: No notable events documented.

## 2023-02-07 NOTE — Anesthesia Procedure Notes (Addendum)
Procedure Name: Intubation Date/Time: 02/07/2023 10:40 AM  Performed by: Michele Rockers, CRNAPre-anesthesia Checklist: Patient identified, Patient being monitored, Timeout performed, Emergency Drugs available and Suction available Patient Re-evaluated:Patient Re-evaluated prior to induction Oxygen Delivery Method: Circle System Utilized Preoxygenation: Pre-oxygenation with 100% oxygen Induction Type: IV induction Ventilation: Mask ventilation without difficulty Laryngoscope Size: Miller and 2 Grade View: Grade I Tube type: Oral Tube size: 7.0 mm Number of attempts: 1 Airway Equipment and Method: Stylet Placement Confirmation: ETT inserted through vocal cords under direct vision, positive ETCO2 and breath sounds checked- equal and bilateral Secured at: 21 cm Tube secured with: Tape Dental Injury: Teeth and Oropharynx as per pre-operative assessment

## 2023-02-07 NOTE — H&P (Signed)
Subjective: Patient is a 67 y.o. female admitted for back and L leg pain. Onset of symptoms was several months ago, gradually worsening since that time.  The pain is rated moderate, and is located at the across the lower back and radiates to L leg. The pain is described as aching and occurs all day. The symptoms have been progressive. Symptoms are exacerbated by exercise, standing, and walking for more than a few minutes. MRI or CT showed spondylolisthesis L4-5, stenosis L2-3 L3-4   Past Medical History:  Diagnosis Date   Arthritis    Chronic renal insufficiency    Diabetes mellitus    Type 2   Fainting    on occasion when she see needles   Hypertension    Obesity    Syncope and collapse 12/22/2015    Past Surgical History:  Procedure Laterality Date   BREAST BIOPSY  2004   results negative   CATARACT EXTRACTION Bilateral    CESAREAN SECTION  12/16/1986   cesect     COLONOSCOPY  06/08/2012   Procedure: COLONOSCOPY;  Surgeon: Daneil Dolin, MD;  Location: [facility];  Service: Endoscopy;  Laterality: N/A;  10:45 AM   EXPLORATORY LAPAROTOMY  12/17/1995   SEPTOPLASTY  12/16/2002   TUBAL LIGATION  1997    Prior to Admission medications   Medication Sig Start Date End Date Taking? Authorizing Provider  Cholecalciferol (VITAMIN D3) 50 MCG (2000 UT) CAPS Take 2,000 Units by mouth daily.   Yes [provider]  lisinopril (PRINIVIL,ZESTRIL) 20 MG tablet Take 20 mg by mouth daily.   Yes [provider]  metFORMIN (GLUCOPHAGE-XR) 500 MG 24 hr tablet Take 500 mg by mouth 2 (two) times daily with a meal.   Yes [provider]  Multiple Vitamin (MULTIVITAMIN) tablet Take 1 tablet by mouth daily.   Yes [provider]  OZEMPIC, 1 MG/DOSE, 4 MG/3ML SOPN Inject 1 mg as directed once a week.   Yes [provider]   No Known Allergies  Social History   Tobacco Use   Smoking status: Never   Smokeless tobacco: Never  Substance Use Topics    Alcohol use: No    Family History  Problem Relation Age of Onset   Diabetes Mother    Syncope episode Mother    Cancer Father        prostate   Syncope episode Brother    Syncope episode Brother      Review of Systems  Positive ROS: neg  All other systems have been reviewed and were otherwise negative with the exception of those mentioned in the HPI and as above.  Objective: Vital signs in last 24 hours: Temp:  [97.9 F (36.6 C)] 97.9 F (36.6 C) (02/23 0804) Pulse Rate:  [82] 82 (02/23 0804) Resp:  [18] 18 (02/23 0804) BP: (137)/(78) 137/78 (02/23 0804) SpO2:  [98 %] 98 % (02/23 0804) Weight:  [99.8 kg] 99.8 kg (02/23 0804)  General Appearance: Alert, cooperative, no distress, appears stated age Head: Normocephalic, without obvious abnormality, atraumatic Eyes: PERRL, conjunctiva/corneas clear, EOM's intact    Neck: Supple, symmetrical, trachea midline Back: Symmetric, no curvature, ROM normal, no CVA tenderness Lungs:  respirations unlabored Heart: Regular rate and rhythm Abdomen: Soft, non-tender Extremities: Extremities normal, atraumatic, no cyanosis or edema Pulses: 2+ and symmetric all extremities Skin: Skin color, texture, turgor normal, no rashes or lesions  NEUROLOGIC:   Mental status: Alert and oriented x4,  no aphasia, good attention span, fund of  knowledge, and memory Motor Exam - grossly normal Sensory Exam - grossly normal Reflexes: 1+ Coordination - grossly normal Gait - grossly normal Balance - grossly normal Cranial Nerves: I: smell Not tested  II: visual acuity  OS: nl    OD: nl  II: visual fields Full to confrontation  II: pupils Equal, round, reactive to light  III,VII: ptosis None  III,IV,VI: extraocular muscles  Full ROM  V: mastication Normal  V: facial light touch sensation  Normal  V,VII: corneal reflex  Present  VII: facial muscle function - upper  Normal  VII: facial muscle function - lower Normal  VIII: hearing Not tested   IX: soft palate elevation  Normal  IX,X: gag reflex Present  XI: trapezius strength  5/5  XI: sternocleidomastoid strength 5/5  XI: neck flexion strength  5/5  XII: tongue strength  Normal    Data Review Lab Results  Component Value Date   WBC 7.0 01/30/2023   HGB 12.9 01/30/2023   HCT 37.8 01/30/2023   MCV 95.5 01/30/2023   PLT 261 01/30/2023   Lab Results  Component Value Date   NA 136 01/30/2023   K 4.6 01/30/2023   CL 102 01/30/2023   CO2 23 01/30/2023   BUN 11 01/30/2023   CREATININE 1.16 (H) 01/30/2023   GLUCOSE 162 (H) 01/30/2023   Lab Results  Component Value Date   INR 1.0 01/30/2023    Assessment/Plan:  Estimated body mass index is 35.51 kg/m as calculated from the following:   Height as of this encounter: '5\' 6"'$  (1.676 m).   Weight as of this encounter: 99.8 kg. Patient admitted for PLIF L4-5, laminectomy L2-3 L3-4. Patient has failed a reasonable attempt at conservative therapy.  I explained the condition and procedure to the patient and answered any questions.  Patient wishes to proceed with procedure as planned. Understands risks/ benefits and typical outcomes of procedure.   Eustace Moore 02/07/2023 9:42 AM

## 2023-02-08 DIAGNOSIS — M48061 Spinal stenosis, lumbar region without neurogenic claudication: Secondary | ICD-10-CM | POA: Diagnosis not present

## 2023-02-08 DIAGNOSIS — Z79899 Other long term (current) drug therapy: Secondary | ICD-10-CM | POA: Diagnosis not present

## 2023-02-08 DIAGNOSIS — Z7984 Long term (current) use of oral hypoglycemic drugs: Secondary | ICD-10-CM | POA: Diagnosis not present

## 2023-02-08 DIAGNOSIS — M4316 Spondylolisthesis, lumbar region: Secondary | ICD-10-CM | POA: Diagnosis not present

## 2023-02-08 DIAGNOSIS — I10 Essential (primary) hypertension: Secondary | ICD-10-CM | POA: Diagnosis not present

## 2023-02-08 DIAGNOSIS — M5416 Radiculopathy, lumbar region: Secondary | ICD-10-CM | POA: Diagnosis not present

## 2023-02-08 DIAGNOSIS — E119 Type 2 diabetes mellitus without complications: Secondary | ICD-10-CM | POA: Diagnosis not present

## 2023-02-08 LAB — GLUCOSE, CAPILLARY: Glucose-Capillary: 132 mg/dL — ABNORMAL HIGH (ref 70–99)

## 2023-02-08 MED ORDER — OXYCODONE-ACETAMINOPHEN 5-325 MG PO TABS: 1.0000 | ORAL_TABLET | ORAL | 0 refills | Status: DC | PRN

## 2023-02-08 MED ORDER — CYCLOBENZAPRINE HCL 5 MG PO TABS: 5.0000 mg | ORAL_TABLET | Freq: Three times a day (TID) | ORAL | 0 refills | Status: DC | PRN

## 2023-02-08 NOTE — Discharge Summary (Signed)
Physician Discharge Summary     Providing Compassionate, Quality Care - Together   Patient ID: Cindy Mcknight MRN: [id number] DOB/AGE: 68/02/1956 68 y.o.  Admit date: 02/07/2023 Discharge date: 02/08/2023  Admission Diagnoses: Dynamic spondylolisthesis L4-5 with severe spinal stenosis, spinal stenosis L2-L3 4, back pain with left lower extremity radiculopathy   Discharge Diagnoses:  Principal Problem:   S/P lumbar fusion   Discharged Condition: good  Hospital Course: Patient underwent an L4-5 PLIF with left L2-3 L3-4 hemilaminectomy medial facetectomy and foraminotomies by Dr. Ronnald Ramp on 02/07/2023. She was admitted to 3C04  following recovery from anesthesia in the PACU. Her postoperative course has been uncomplicated. She has worked with both physical and occupational therapies who feel the patient is ready for discharge home. She is ambulating independently with the aid of a walker and without difficulty. She is tolerating a normal diet. She is not having any bowel or bladder dysfunction. Her pain is well-controlled with oral pain medication. She is ready for discharge home.   Consults: PT/OT  Significant Diagnostic Studies: radiology: DG Lumbar Spine 2-3 Views  Result Date: 02/07/2023 CLINICAL DATA:  Fluoro guidance provided EXAM: LUMBAR SPINE - 2-3 VIEW FINDINGS: Dose: 38.0 mGy Fluoro time 45s IMPRESSION: C-arm fluoro guidance provided. Electronically Signed   By: Sammie Bench M.D.   On: 02/07/2023 14:09   DG C-Arm 1-60 Min-No Report  Result Date: 02/07/2023 Fluoroscopy was utilized by the requesting physician.  No radiographic interpretation.   DG C-Arm 1-60 Min-No Report  Result Date: 02/07/2023 Fluoroscopy was utilized by the requesting physician.  No radiographic interpretation.   DG C-Arm 1-60 Min-No Report  Result Date: 02/07/2023 Fluoroscopy was utilized by the requesting physician.  No radiographic interpretation.     Treatments: surgery:   1. Decompressive  lumbar laminectomy, hemi facetectomy and foraminotomies L4-5 bilaterally requiring more work than would be required for a simple exposure of the disk for PLIF in order to adequately decompress the neural elements and address the spinal stenosis 2. Posterior lumbar interbody fusion L4-5 using PTI interbody cages packed with morcellized allograft and autograft  3. Posterior fixation L4-5 using ATEC cortical pedicle screws.  4. Intertransverse arthrodesis L4-5 using morcellized autograft and allograft. 5.  Left L2-3 L3-4 hemilaminectomy medial facetectomy and foraminotomies followed by sublaminar decompression, these levels are separate from the PLIF level    Discharge Exam: Blood pressure 114/62, pulse 66, temperature 97.6 F (36.4 C), temperature source Oral, resp. rate 18, height '5\' 6"'$  (1.676 m), weight 99.8 kg, SpO2 99 %.  Alert and oriented x 4 PERRLA CN II-XII grossly intact MAE, Strength and sensation intact Incision is covered with Honeycomb dressing and Steri Strips; Dressing is clean, dry, and intact   Disposition: Discharge disposition: 01-Home or Self Care       Discharge Instructions     Call MD for:  difficulty breathing, headache or visual disturbances   Complete by: As directed    Call MD for:  hives   Complete by: As directed    Call MD for:  persistant nausea and vomiting   Complete by: As directed    Call MD for:  redness, tenderness, or signs of infection (pain, swelling, redness, odor or green/yellow discharge around incision site)   Complete by: As directed    Call MD for:  severe uncontrolled pain   Complete by: As directed    Call MD for:  temperature >100.4   Complete by: As directed    Diet - low sodium heart healthy  Complete by: As directed    If the dressing is still on your incision site when you go home, remove it on the third day after your surgery date. Remove dressing if it begins to fall off, or if it is dirty or damaged before the third day.    Complete by: As directed    Increase activity slowly   Complete by: As directed       Allergies as of 02/08/2023   No Known Allergies      Medication List     TAKE these medications    cyclobenzaprine 5 MG tablet Commonly known as: FLEXERIL Take 1 tablet (5 mg total) by mouth 3 (three) times daily as needed for muscle spasms.   lisinopril 20 MG tablet Commonly known as: ZESTRIL Take 20 mg by mouth daily.   metFORMIN 500 MG 24 hr tablet Commonly known as: GLUCOPHAGE-XR Take 500 mg by mouth 2 (two) times daily with a meal.   multivitamin tablet Take 1 tablet by mouth daily.   oxyCODONE-acetaminophen 5-325 MG tablet Commonly known as: Percocet Take 1-2 tablets by mouth every 4 (four) hours as needed for severe pain (postoperative pain).   Ozempic (1 MG/DOSE) 4 MG/3ML Sopn Generic drug: Semaglutide (1 MG/DOSE) Inject 1 mg as directed once a week.   Vitamin D3 50 MCG (2000 UT) Caps Generic drug: Cholecalciferol Take 2,000 Units by mouth daily.               Durable Medical Equipment  (From admission, onward)           Start     Ordered   02/07/23 1520  DME Walker rolling  Once       Question:  Patient needs a walker to treat with the following condition  Answer:  S/P lumbar fusion   02/07/23 1519   02/07/23 1520  DME 3 n 1  Once        02/07/23 1519              Discharge Care Instructions  (From admission, onward)           Start     Ordered   02/08/23 0000  If the dressing is still on your incision site when you go home, remove it on the third day after your surgery date. Remove dressing if it begins to fall off, or if it is dirty or damaged before the third day.        02/08/23 [id number]            Follow-up Information     Eustace Moore, MD. Go on 02/20/2023.   Specialty: Neurosurgery Why: First post op appointment is on 02/20/2023 at 2:45 PM. Contact information: [address] [place]  [address] [phone]                 Signed: Viona Gilmore, DNP, AGNP-C Nurse Practitioner  [facility] [place] [address], [address], [place], [place] [address] P: [phone]    F: [phone]  02/08/2023, 8:29 AM

## 2023-02-08 NOTE — Plan of Care (Signed)
  Problem: Education: Goal: Ability to verbalize activity precautions or restrictions will improve Outcome: Completed/Met Goal: Knowledge of the prescribed therapeutic regimen will improve Outcome: Completed/Met Goal: Understanding of discharge needs will improve Outcome: Completed/Met   Problem: Activity: Goal: Ability to avoid complications of mobility impairment will improve Outcome: Completed/Met Goal: Ability to tolerate increased activity will improve Outcome: Completed/Met Goal: Will remain free from falls Outcome: Completed/Met   Problem: Bowel/Gastric: Goal: Gastrointestinal status for postoperative course will improve Outcome: Completed/Met   Problem: Clinical Measurements: Goal: Ability to maintain clinical measurements within normal limits will improve Outcome: Completed/Met Goal: Postoperative complications will be avoided or minimized Outcome: Completed/Met Goal: Diagnostic test results will improve Outcome: Completed/Met   Problem: Pain Management: Goal: Pain level will decrease Outcome: Completed/Met   Problem: Skin Integrity: Goal: Will show signs of wound healing Outcome: Completed/Met   Problem: Health Behavior/Discharge Planning: Goal: Identification of resources available to assist in meeting health care needs will improve Outcome: Completed/Met   Problem: Bladder/Genitourinary: Goal: Urinary functional status for postoperative course will improve Outcome: Completed/Met  Patient alert and oriented, VSS, void, ambulate, surgical site clean and dry. D/c instructions explain and given to the patient and husband. D/c patient home per order.

## 2023-02-08 NOTE — Evaluation (Signed)
Physical Therapy Evaluation  Patient Details Name: Cindy Mcknight MRN: [id number] DOB: Jul 31, 1956 Today's Date: 02/08/2023  History of Present Illness  Pt is a 67 y/o female who underwent L4-5 PLIF with left L2-3 L3-4 hemilaminectomy medial facetectomy and foraminotomies on 2/23. PMHx: arthritis, chronic renal insufficiency, DM II, HTN, syncope   Clinical Impression  Pt admitted with above diagnosis. At the time of PT eval, pt was able to demonstrate transfers and ambulation with gross min guard assist and no AD. Pt was educated on precautions, brace application/wearing schedule, appropriate activity progression, and car transfer. Pt currently with functional limitations due to the deficits listed below (see PT Problem List). Pt will benefit from skilled PT to increase their independence and safety with mobility to allow discharge to the venue listed below.         Recommendations for follow up therapy are one component of a multi-disciplinary discharge planning process, led by the attending physician.  Recommendations may be updated based on patient status, additional functional criteria and insurance authorization.  Follow Up Recommendations No PT follow up      Assistance Recommended at Discharge PRN  Patient can return home with the following  A little help with walking and/or transfers;A little help with bathing/dressing/bathroom;Assistance with cooking/housework;Assist for transportation;Help with stairs or ramp for entrance    Equipment Recommendations None recommended by PT  Recommendations for Other Services       Functional Status Assessment Patient has had a recent decline in their functional status and demonstrates the ability to make significant improvements in function in a reasonable and predictable amount of time.     Precautions / Restrictions Precautions Precautions: Fall;Back Precaution Booklet Issued: Yes (comment) Precaution Comments: Reviewed handout and pt was  cued for precautions during functional mobility. Required Braces or Orthoses: Spinal Brace Spinal Brace: Applied in standing position (Husband assisted in standing. Recommend pt dons in sitting if doing so independently.) Restrictions Weight Bearing Restrictions: No      Mobility  Bed Mobility Overal bed mobility: Needs Assistance Bed Mobility: Rolling, Sidelying to Sit Rolling: Min guard Sidelying to sit: Min guard       General bed mobility comments: Multimodal cues for optimal log roll technique. No assist required but poor carryover of education initially.    Transfers Overall transfer level: Needs assistance Equipment used: None Transfers: Sit to/from Stand Sit to Stand: Supervision           General transfer comment: Pt was able to power up to full stand without assistance. Supervision provided for safety. Overall good maintenance of posture throughout.    Ambulation/Gait Ambulation/Gait assistance: Min guard Gait Distance (Feet): 300 Feet Assistive device: None Gait Pattern/deviations: Step-through pattern, Decreased stride length, Trunk flexed Gait velocity: Decreased Gait velocity interpretation: <1.8 ft/sec, indicate of risk for recurrent falls   General Gait Details: VC's throughout for improved posture and forward gaze. Somewhat uncoordinated landing during heel strike.  Stairs Stairs: Yes Stairs assistance: Min guard Stair Management: One rail Right, Step to pattern, Forwards Number of Stairs: 5 General stair comments: VC's for sequencing and general safety.  Wheelchair Mobility    Modified Rankin (Stroke Patients Only)       Balance Overall balance assessment: Needs assistance Sitting-balance support: Feet supported Sitting balance-Leahy Scale: Good     Standing balance support: No upper extremity supported, During functional activity Standing balance-Leahy Scale: Fair  Pertinent Vitals/Pain  Pain Assessment Pain Assessment: Faces Faces Pain Scale: Hurts little more Pain Location: back Pain Descriptors / Indicators: Discomfort Pain Intervention(s): Limited activity within patient's tolerance, Monitored during session, Repositioned    Home Living Family/patient expects to be discharged to:: Private residence Living Arrangements: Spouse/significant other Available Help at Discharge: Family;Available 24 hours/day Type of Home: House Home Access: Stairs to enter Entrance Stairs-Rails: Psychiatric nurse of Steps: 5   Home Layout: One level Home Equipment: Cane - single point      Prior Function Prior Level of Function : Independent/Modified Independent;Driving             Mobility Comments: no AD ADLs Comments: indep     Hand Dominance   Dominant Hand: Right    Extremity/Trunk Assessment   Upper Extremity Assessment Upper Extremity Assessment: Overall WFL for tasks assessed    Lower Extremity Assessment Lower Extremity Assessment: Generalized weakness    Cervical / Trunk Assessment Cervical / Trunk Assessment: Back Surgery  Communication   Communication: No difficulties  Cognition Arousal/Alertness: Awake/alert Behavior During Therapy: WFL for tasks assessed/performed Overall Cognitive Status: Within Functional Limits for tasks assessed                                          General Comments General comments (skin integrity, edema, etc.): VSS on RA, husband present and supportive    Exercises     Assessment/Plan    PT Assessment Patient needs continued PT services  PT Problem List Decreased strength;Decreased range of motion;Decreased activity tolerance;Decreased balance;Decreased mobility;Decreased knowledge of use of DME;Decreased safety awareness;Decreased knowledge of precautions;Pain       PT Treatment Interventions DME instruction;Gait training;Stair training;Functional mobility training;Therapeutic  activities;Therapeutic exercise;Balance training;Patient/family education    PT Goals (Current goals can be found in the Care Plan section)  Acute Rehab PT Goals Patient Stated Goal: Home today PT Goal Formulation: With patient/family Time For Goal Achievement: 02/15/23 Potential to Achieve Goals: Good    Frequency Min 5X/week     Co-evaluation               AM-PAC PT "6 Clicks" Mobility  Outcome Measure Help needed turning from your back to your side while in a flat bed without using bedrails?: A Little Help needed moving from lying on your back to sitting on the side of a flat bed without using bedrails?: A Little Help needed moving to and from a bed to a chair (including a wheelchair)?: A Little Help needed standing up from a chair using your arms (e.g., wheelchair or bedside chair)?: A Little Help needed to walk in hospital room?: A Little Help needed climbing 3-5 steps with a railing? : A Little 6 Click Score: 18    End of Session Equipment Utilized During Treatment: Gait belt;Back brace Activity Tolerance: Patient tolerated treatment well Patient left: in bed;with call bell/phone within reach;with family/visitor present Nurse Communication: Mobility status PT Visit Diagnosis: Unsteadiness on feet (R26.81);Pain Pain - part of body:  (back)    Time: [id number] PT Time Calculation (min) (ACUTE ONLY): 27 min   Charges:   PT Evaluation $PT Eval Low Complexity: 1 Low PT Treatments $Gait Training: 8-22 mins        Rolinda Roan, PT, DPT Acute Rehabilitation Services Secure Chat Preferred Office: [phone]   Thelma Comp 02/08/2023, 10:42 AM

## 2023-02-08 NOTE — Evaluation (Signed)
Occupational Therapy Evaluation Patient Details Name: Cindy Mcknight MRN: [id number] DOB: Jun 14, 1956 Today's Date: 02/08/2023   History of Present Illness Cindy Mcknight is a 67 yo female who underwent L4-5 PLIF with left L2-3 L3-4 hemilaminectomy medial facetectomy and foraminotomies on 2/23. PMHx: arthritis, chronic renal insufficiency, DM II, HTN, syncope   Clinical Impression   Cindy Mcknight was evaluated s/p the above spine surgery. She is indep at baseline. Upon evaluation she had mild functional limitations due to expected back pain, knowledge of precautions and decreased activity tolerance. Overall she required min G for LB ADLs, transfers and mobility with RW and ser up A for UB ADLs. Provided cues and education on spinal precautions and compensatory techniques throughout, handout provided and pt demonstrated good recall. Pt does not require further acute OT services. Recommend d/c home with support of family.        Recommendations for follow up therapy are one component of a multi-disciplinary discharge planning process, led by the attending physician.  Recommendations may be updated based on patient status, additional functional criteria and insurance authorization.   Follow Up Recommendations  No OT follow up     Assistance Recommended at Discharge PRN  Patient can return home with the following A little help with walking and/or transfers;A little help with bathing/dressing/bathroom;Assistance with cooking/housework;Assist for transportation    Functional Status Assessment  Patient has had a recent decline in their functional status and demonstrates the ability to make significant improvements in function in a reasonable and predictable amount of time.  Equipment Recommendations  None recommended by OT    Recommendations for Other Services       Precautions / Restrictions Precautions Precautions: None;Fall;Back Precaution Booklet Issued: Yes (comment) Required Braces or Orthoses:  Spinal Brace Spinal Brace: Applied in sitting position Restrictions Weight Bearing Restrictions: No      Mobility Bed Mobility Overal bed mobility: Needs Assistance Bed Mobility: Rolling, Sidelying to Sit Rolling: Supervision Sidelying to sit: Supervision            Transfers Overall transfer level: Needs assistance Equipment used: Rolling walker (2 wheels) Transfers: Sit to/from Stand Sit to Stand: Supervision                  Balance Overall balance assessment: Needs assistance Sitting-balance support: Feet supported Sitting balance-Leahy Scale: Good     Standing balance support: No upper extremity supported, During functional activity Standing balance-Leahy Scale: Fair                             ADL either performed or assessed with clinical judgement   ADL Overall ADL's : Needs assistance/impaired Eating/Feeding: Independent;Sitting   Grooming: Supervision/safety;Standing   Upper Body Bathing: Set up;Sitting   Lower Body Bathing: Min guard;Sit to/from stand   Upper Body Dressing : Set up;Sitting   Lower Body Dressing: Min guard;Sit to/from stand   Toilet Transfer: Supervision/safety;Ambulation;Rolling walker (2 wheels)   Toileting- Clothing Manipulation and Hygiene: Supervision/safety;Sitting/lateral lean       Functional mobility during ADLs: Supervision/safety;Rolling walker (2 wheels) General ADL Comments: minimal cues throughout to maintain back precautions     Vision Baseline Vision/History: 0 No visual deficits Vision Assessment?: No apparent visual deficits     Perception Perception Perception Tested?: No   Praxis Praxis Praxis tested?: Not tested    Pertinent Vitals/Pain Pain Assessment Pain Assessment: Faces Faces Pain Scale: Hurts little more Pain Location: back Pain Descriptors / Indicators: Discomfort  Pain Intervention(s): Limited activity within patient's tolerance, Monitored during session     Hand  Dominance Right   Extremity/Trunk Assessment Upper Extremity Assessment Upper Extremity Assessment: Overall WFL for tasks assessed   Lower Extremity Assessment Lower Extremity Assessment: Overall WFL for tasks assessed   Cervical / Trunk Assessment Cervical / Trunk Assessment: Back Surgery   Communication Communication Communication: No difficulties   Cognition Arousal/Alertness: Awake/alert Behavior During Therapy: WFL for tasks assessed/performed Overall Cognitive Status: Within Functional Limits for tasks assessed                                       General Comments  VSS on RA, husband present and supportive    Exercises     Shoulder Instructions      Home Living Family/patient expects to be discharged to:: Private residence Living Arrangements: Spouse/significant other Available Help at Discharge: Family;Available 24 hours/day Type of Home: House Home Access: Stairs to enter [employer] of Steps: 5 Entrance Stairs-Rails: Right;Left Home Layout: One level     Bathroom Shower/Tub: Occupational psychologist: Handicapped height     Home Equipment: [place] - single point          Prior Functioning/Environment Prior Level of Function : Independent/Modified Independent;Driving             Mobility Comments: no AD ADLs Comments: indep        OT Problem List: Decreased strength;Decreased range of motion;Decreased activity tolerance;Impaired balance (sitting and/or standing);Decreased safety awareness;Decreased knowledge of use of DME or AE;Decreased knowledge of precautions;Pain      OT Treatment/Interventions:      OT Goals(Current goals can be found in the care plan section) Acute Rehab OT Goals Patient Stated Goal: home OT Goal Formulation: With patient Time For Goal Achievement: 02/08/23 Potential to Achieve Goals: Good  OT Frequency:      Co-evaluation              AM-PAC OT "6 Clicks" Daily Activity      Outcome Measure Help from another person eating meals?: None Help from another person taking care of personal grooming?: A Little Help from another person toileting, which includes using toliet, bedpan, or urinal?: A Little Help from another person bathing (including washing, rinsing, drying)?: A Little Help from another person to put on and taking off regular upper body clothing?: None Help from another person to put on and taking off regular lower body clothing?: A Little 6 Click Score: 20   End of Session Equipment Utilized During Treatment: Rolling walker (2 wheels) Nurse Communication: Mobility status  Activity Tolerance: Patient tolerated treatment well Patient left: in bed;with call bell/phone within reach  OT Visit Diagnosis: Unsteadiness on feet (R26.81);Other abnormalities of gait and mobility (R26.89);Muscle weakness (generalized) (M62.81);Hemiplegia and hemiparesis                Time: 0805-0825 OT Time Calculation (min): 20 min Charges:  OT General Charges $OT Visit: 1 Visit OT Evaluation $OT Eval Moderate Complexity: 1 Mod  Shade Flood, OTR/L [place] Office [place] Communication Preferred   Elliot Cousin 02/08/2023, 9:41 AM

## 2023-02-08 NOTE — Discharge Instructions (Addendum)
Wound Care Keep incision covered and dry until post op day 3. You may remove the Honeycomb dressing on post op day 3. Leave steri-strips on back.  They will fall off by themselves. Do not put any creams, lotions, or ointments on incision. You are fine to shower. Let water run over incision and pat dry.  Activity Walk each and every day, increasing distance each day. No lifting greater than 5 lbs.  Avoid excessive back motion. No driving for 2 weeks; may ride as a passenger locally.  Diet Resume your normal diet.   Return to Work Will be discussed at your follow up appointment.  Call Your Doctor If Any of These Occur Redness, drainage, or swelling at the wound.  Temperature greater than 101 degrees. Severe pain not relieved by pain medication. Incision starts to come apart.  Follow Up Appt Call [phone] today for appointment in 2-3 weeks if you don't already have one or for any problems.

## 2023-02-08 NOTE — Care Management (Signed)
Patient with order to DC to home today. Unit staff to provide DME needed for home.   No HH needs identified Patient will have family/ friends provide transportation home. No other TOC needs identified for DC

## 2023-02-10 ENCOUNTER — Other Ambulatory Visit: Payer: Self-pay

## 2023-02-12 ENCOUNTER — Encounter (HOSPITAL_COMMUNITY): Payer: Self-pay | Admitting: Neurological Surgery

## 2023-03-06 DIAGNOSIS — Z6835 Body mass index (BMI) 35.0-35.9, adult: Secondary | ICD-10-CM | POA: Diagnosis not present

## 2023-03-06 DIAGNOSIS — M4316 Spondylolisthesis, lumbar region: Secondary | ICD-10-CM | POA: Diagnosis not present

## 2023-03-06 DIAGNOSIS — M5416 Radiculopathy, lumbar region: Secondary | ICD-10-CM | POA: Diagnosis not present

## 2023-03-10 ENCOUNTER — Other Ambulatory Visit: Payer: Self-pay | Admitting: Neurological Surgery

## 2023-03-10 DIAGNOSIS — Z8719 Personal history of other diseases of the digestive system: Secondary | ICD-10-CM

## 2023-04-14 ENCOUNTER — Ambulatory Visit
Admission: RE | Admit: 2023-04-14 | Discharge: 2023-04-14 | Disposition: A | Discharge status: Home / Self Care | Payer: Medicare PPO | Source: Ambulatory Visit | Attending: Neurological Surgery | Admitting: Neurological Surgery

## 2023-04-14 DIAGNOSIS — Z8719 Personal history of other diseases of the digestive system: Secondary | ICD-10-CM

## 2023-04-14 DIAGNOSIS — K760 Fatty (change of) liver, not elsewhere classified: Secondary | ICD-10-CM | POA: Diagnosis not present

## 2023-04-16 DIAGNOSIS — E7800 Pure hypercholesterolemia, unspecified: Secondary | ICD-10-CM | POA: Diagnosis not present

## 2023-04-16 DIAGNOSIS — E1122 Type 2 diabetes mellitus with diabetic chronic kidney disease: Secondary | ICD-10-CM | POA: Diagnosis not present

## 2023-04-16 DIAGNOSIS — N183 Chronic kidney disease, stage 3 unspecified: Secondary | ICD-10-CM | POA: Diagnosis not present

## 2023-04-16 DIAGNOSIS — Z1159 Encounter for screening for other viral diseases: Secondary | ICD-10-CM | POA: Diagnosis not present

## 2023-04-16 DIAGNOSIS — E1121 Type 2 diabetes mellitus with diabetic nephropathy: Secondary | ICD-10-CM | POA: Diagnosis not present

## 2023-04-16 DIAGNOSIS — E1165 Type 2 diabetes mellitus with hyperglycemia: Secondary | ICD-10-CM | POA: Diagnosis not present

## 2023-04-23 DIAGNOSIS — E1121 Type 2 diabetes mellitus with diabetic nephropathy: Secondary | ICD-10-CM | POA: Diagnosis not present

## 2023-04-23 DIAGNOSIS — R7989 Other specified abnormal findings of blood chemistry: Secondary | ICD-10-CM | POA: Diagnosis not present

## 2023-04-23 DIAGNOSIS — M179 Osteoarthritis of knee, unspecified: Secondary | ICD-10-CM | POA: Diagnosis not present

## 2023-04-23 DIAGNOSIS — M5430 Sciatica, unspecified side: Secondary | ICD-10-CM | POA: Diagnosis not present

## 2023-04-23 DIAGNOSIS — I10 Essential (primary) hypertension: Secondary | ICD-10-CM | POA: Diagnosis not present

## 2023-04-23 DIAGNOSIS — E7801 Familial hypercholesterolemia: Secondary | ICD-10-CM | POA: Diagnosis not present

## 2023-04-23 DIAGNOSIS — E1122 Type 2 diabetes mellitus with diabetic chronic kidney disease: Secondary | ICD-10-CM | POA: Diagnosis not present

## 2023-04-23 DIAGNOSIS — R5383 Other fatigue: Secondary | ICD-10-CM | POA: Diagnosis not present

## 2023-05-08 ENCOUNTER — Telehealth: Payer: Self-pay | Admitting: *Deleted

## 2023-05-08 NOTE — Telephone Encounter (Signed)
  Procedure: Colonoscopy (Pt only wants miralax prep)  Estimated body mass index is 35.51 kg/m as calculated from the following:   Height as of 02/07/23: 5\' 6"  (1.676 m).   Weight as of 02/07/23: 220 lb (99.8 kg).  Have you had a colonoscopy before?  05/2012, Dr. Jena Gauss  Do you have family history of colon cancer?  no  Do you have a family history of polyps? no  Previous colonoscopy with polyps removed? no  Do you have a history colorectal cancer?   no  Are you diabetic?  Yes type 2  Do you have a prosthetic or mechanical heart valve? no  Do you have a pacemaker/defibrillator?   no  Have you had endocarditis/atrial fibrillation?  no  Do you use supplemental oxygen/CPAP?  no  Have you had joint replacement within the last 12 months?  no  Do you tend to be constipated or have to use laxatives?  no   Do you have history of alcohol use? If yes, how much and how often.  no  Do you have history or are you using drugs? If yes, what do are you  using?  no  Have you ever had a stroke/heart attack?  no  Have you ever had a heart or other vascular stent placed,?no  Do you take weight loss medication? no  female patients,: have you had a hysterectomy? no                              are you post menopausal?  yes                              do you still have your menstrual cycle? no    Date of last menstrual period?   Do you take any blood-thinning medications such as: (Plavix, aspirin, Coumadin, Aggrenox, Brilinta, Xarelto, Eliquis, Pradaxa, Savaysa or Effient)? no  If yes we need the name, milligram, dosage and who is prescribing doctor:               Current Outpatient Medications  Medication Sig Dispense Refill   acetaminophen (TYLENOL) 500 MG tablet Take 500 mg by mouth every 6 (six) hours as needed.     Calcium Carb-Cholecalciferol (CALCIUM 600+D3 PO) Take by mouth. Once daily     Cholecalciferol (VITAMIN D3) 50 MCG (2000 UT) CAPS Take 2,000 Units by mouth daily.      cyclobenzaprine (FLEXERIL) 5 MG tablet Take 1 tablet (5 mg total) by mouth 3 (three) times daily as needed for muscle spasms. 30 tablet 0   gabapentin (NEURONTIN) 300 MG capsule Take 300 mg by mouth as needed.     lisinopril (PRINIVIL,ZESTRIL) 20 MG tablet Take 20 mg by mouth daily.     metFORMIN (GLUCOPHAGE-XR) 500 MG 24 hr tablet Take 500 mg by mouth 2 (two) times daily with a meal.     OZEMPIC, 1 MG/DOSE, 4 MG/3ML SOPN Inject 1 mg as directed once a week.     No current facility-administered medications for this visit.    No Known Allergies

## 2023-05-22 DIAGNOSIS — Z6834 Body mass index (BMI) 34.0-34.9, adult: Secondary | ICD-10-CM | POA: Diagnosis not present

## 2023-05-22 DIAGNOSIS — M5416 Radiculopathy, lumbar region: Secondary | ICD-10-CM | POA: Diagnosis not present

## 2023-05-26 NOTE — Telephone Encounter (Signed)
Appropriate, ASA 2.   No metformin day of procedure.  Hole Ozempic one week prior.

## 2023-05-27 NOTE — Telephone Encounter (Signed)
LMOVM to call back. Per Dr. Jena Gauss, okay to give miralax prep but will need to do 2 days of clears and make sure they get plenty of miralax.

## 2023-05-28 ENCOUNTER — Encounter: Payer: Self-pay | Admitting: *Deleted

## 2023-05-28 NOTE — Telephone Encounter (Signed)
Pt has been scheduled for 06/26/23. Instructions sent via MyChart.

## 2023-05-28 NOTE — Telephone Encounter (Signed)
LMOVM to call back 

## 2023-05-28 NOTE — Telephone Encounter (Signed)
[facility] PA: Approved Authorization #[id number]  Tracking #[id number] Dates of service 06/26/2023 - 09/23/2023

## 2023-06-04 DIAGNOSIS — M48061 Spinal stenosis, lumbar region without neurogenic claudication: Secondary | ICD-10-CM | POA: Diagnosis not present

## 2023-06-04 DIAGNOSIS — M545 Low back pain, unspecified: Secondary | ICD-10-CM | POA: Diagnosis not present

## 2023-06-04 DIAGNOSIS — M4316 Spondylolisthesis, lumbar region: Secondary | ICD-10-CM | POA: Diagnosis not present

## 2023-06-04 DIAGNOSIS — M5416 Radiculopathy, lumbar region: Secondary | ICD-10-CM | POA: Diagnosis not present

## 2023-06-12 DIAGNOSIS — M5416 Radiculopathy, lumbar region: Secondary | ICD-10-CM | POA: Diagnosis not present

## 2023-06-12 DIAGNOSIS — Z6834 Body mass index (BMI) 34.0-34.9, adult: Secondary | ICD-10-CM | POA: Diagnosis not present

## 2023-06-16 ENCOUNTER — Telehealth: Payer: Self-pay | Admitting: *Deleted

## 2023-06-16 NOTE — Telephone Encounter (Signed)
Pt recently had back surgery and she says there is no way she can do the colonoscopy on 06/26/23 with Dr.Rourk. She wants to be rescheduled in August. FYI

## 2023-06-17 NOTE — Telephone Encounter (Signed)
Noted. Pt add to encounters to call and schedule for August.

## 2023-06-26 ENCOUNTER — Ambulatory Visit (HOSPITAL_COMMUNITY): Admit: 2023-06-26 | Payer: Medicare PPO | Admitting: Internal Medicine

## 2023-06-26 ENCOUNTER — Encounter (HOSPITAL_COMMUNITY): Payer: Self-pay

## 2023-06-26 SURGERY — COLONOSCOPY WITH PROPOFOL
Anesthesia: Monitor Anesthesia Care

## 2023-06-30 DIAGNOSIS — M5416 Radiculopathy, lumbar region: Secondary | ICD-10-CM | POA: Diagnosis not present

## 2023-07-01 ENCOUNTER — Telehealth: Payer: Self-pay | Admitting: *Deleted

## 2023-07-01 NOTE — Telephone Encounter (Signed)
Pt returned call and stated it probably would be several months before she could do the colonoscopy. She says she got injections in her back yesterday and will probably be having another back surgery. Advised pt to call once back surgery is completed and is feeling better. Pt stated she would call back.

## 2023-07-01 NOTE — Telephone Encounter (Signed)
LMTRC to schedule TCS w/Dr.Rourk, ASA 2

## 2023-07-31 DIAGNOSIS — Z6834 Body mass index (BMI) 34.0-34.9, adult: Secondary | ICD-10-CM | POA: Diagnosis not present

## 2023-07-31 DIAGNOSIS — M5416 Radiculopathy, lumbar region: Secondary | ICD-10-CM | POA: Diagnosis not present

## 2023-08-06 ENCOUNTER — Encounter: Payer: Self-pay | Admitting: Neurological Surgery

## 2023-08-06 ENCOUNTER — Other Ambulatory Visit: Payer: Self-pay | Admitting: Neurological Surgery

## 2023-08-06 DIAGNOSIS — M5416 Radiculopathy, lumbar region: Secondary | ICD-10-CM

## 2023-08-12 ENCOUNTER — Encounter: Payer: Self-pay | Admitting: Neurological Surgery

## 2023-08-15 DIAGNOSIS — E1129 Type 2 diabetes mellitus with other diabetic kidney complication: Secondary | ICD-10-CM | POA: Diagnosis not present

## 2023-08-15 DIAGNOSIS — E7800 Pure hypercholesterolemia, unspecified: Secondary | ICD-10-CM | POA: Diagnosis not present

## 2023-08-15 DIAGNOSIS — N1831 Chronic kidney disease, stage 3a: Secondary | ICD-10-CM | POA: Diagnosis not present

## 2023-08-15 DIAGNOSIS — E7801 Familial hypercholesterolemia: Secondary | ICD-10-CM | POA: Diagnosis not present

## 2023-08-15 DIAGNOSIS — I10 Essential (primary) hypertension: Secondary | ICD-10-CM | POA: Diagnosis not present

## 2023-08-15 DIAGNOSIS — E1122 Type 2 diabetes mellitus with diabetic chronic kidney disease: Secondary | ICD-10-CM | POA: Diagnosis not present

## 2023-08-15 DIAGNOSIS — E1165 Type 2 diabetes mellitus with hyperglycemia: Secondary | ICD-10-CM | POA: Diagnosis not present

## 2023-08-21 DIAGNOSIS — E1122 Type 2 diabetes mellitus with diabetic chronic kidney disease: Secondary | ICD-10-CM | POA: Diagnosis not present

## 2023-08-21 DIAGNOSIS — M179 Osteoarthritis of knee, unspecified: Secondary | ICD-10-CM | POA: Diagnosis not present

## 2023-08-21 DIAGNOSIS — M5430 Sciatica, unspecified side: Secondary | ICD-10-CM | POA: Diagnosis not present

## 2023-08-21 DIAGNOSIS — E7801 Familial hypercholesterolemia: Secondary | ICD-10-CM | POA: Diagnosis not present

## 2023-08-21 DIAGNOSIS — R5383 Other fatigue: Secondary | ICD-10-CM | POA: Diagnosis not present

## 2023-08-21 DIAGNOSIS — R252 Cramp and spasm: Secondary | ICD-10-CM | POA: Diagnosis not present

## 2023-08-21 DIAGNOSIS — I10 Essential (primary) hypertension: Secondary | ICD-10-CM | POA: Diagnosis not present

## 2023-08-21 DIAGNOSIS — R7989 Other specified abnormal findings of blood chemistry: Secondary | ICD-10-CM | POA: Diagnosis not present

## 2023-08-21 DIAGNOSIS — E1121 Type 2 diabetes mellitus with diabetic nephropathy: Secondary | ICD-10-CM | POA: Diagnosis not present

## 2023-08-25 ENCOUNTER — Ambulatory Visit
Admission: RE | Admit: 2023-08-25 | Discharge: 2023-08-25 | Disposition: A | Discharge status: Home / Self Care | Payer: Medicare PPO | Source: Ambulatory Visit | Attending: Neurological Surgery | Admitting: Neurological Surgery

## 2023-08-25 DIAGNOSIS — M5416 Radiculopathy, lumbar region: Secondary | ICD-10-CM

## 2023-08-25 DIAGNOSIS — M48061 Spinal stenosis, lumbar region without neurogenic claudication: Secondary | ICD-10-CM | POA: Diagnosis not present

## 2023-08-25 DIAGNOSIS — M5127 Other intervertebral disc displacement, lumbosacral region: Secondary | ICD-10-CM | POA: Diagnosis not present

## 2023-08-25 MED ORDER — DIAZEPAM 5 MG PO TABS
5.0000 mg | ORAL_TABLET | Freq: Once | ORAL | Status: AC
  Administered 2023-08-25: 5 mg via ORAL

## 2023-08-25 MED ORDER — IOPAMIDOL (ISOVUE-M 200) INJECTION 41%
20.0000 mL | Freq: Once | INTRAMUSCULAR | Status: AC
  Administered 2023-08-25: 20 mL via INTRATHECAL

## 2023-08-25 MED ORDER — MEPERIDINE HCL 50 MG/ML IJ SOLN: 50.0000 mg | Freq: Once | INTRAMUSCULAR | Status: DC | PRN

## 2023-08-25 MED ORDER — ONDANSETRON HCL 4 MG/2ML IJ SOLN: 4.0000 mg | Freq: Once | INTRAMUSCULAR | Status: DC | PRN

## 2023-08-25 NOTE — Discharge Instructions (Signed)

## 2023-09-04 ENCOUNTER — Other Ambulatory Visit: Payer: Self-pay | Admitting: Neurological Surgery

## 2023-09-04 DIAGNOSIS — M5417 Radiculopathy, lumbosacral region: Secondary | ICD-10-CM | POA: Diagnosis not present

## 2023-09-04 DIAGNOSIS — Z6834 Body mass index (BMI) 34.0-34.9, adult: Secondary | ICD-10-CM | POA: Diagnosis not present

## 2023-09-12 NOTE — Pre-Procedure Instructions (Signed)
Surgical Instructions    Your procedure is scheduled on October, 7, 2024.  Report to [facility] Main Entrance "A" at 8:00 A.M., then check in with the Admitting office.  Call this number if you have problems the morning of surgery:  [phone]   If you have any questions prior to your surgery date call [phone]: Open Monday-Friday 8am-4pm If you experience any cold or flu symptoms such as cough, fever, chills, shortness of breath, etc. between now and your scheduled surgery, please notify us at the above number     Remember:  Do not eat or drink after midnight the night before your surgery    Take these medicines the morning of surgery with A SIP OF WATER:  rosuvastatin (CRESTOR)  acetaminophen (TYLENOL) if needed  As of today, STOP taking any Aspirin (unless otherwise instructed by your surgeon) Aleve, Naproxen, Ibuprofen, Motrin, Advil, Goody's, BC's, all herbal medications, fish oil, and all vitamins.          WHAT DO I DO ABOUT MY DIABETES MEDICATION?   Do not take oral diabetes medicines (pills) the morning of surgery. DO NOT TAKE Metformin (Glucophage) the day of surgery.   DO NOT TAKE Ozempic for 7 days prior to surgery. DO NOT TAKE after 09/14/23.   The day of surgery, do not take other diabetes injectables, including Byetta (exenatide), Bydureon (exenatide ER), Victoza (liraglutide), or Trulicity (dulaglutide).   If your CBG is greater than 220 mg/dL, you may take  of your sliding scale (correction) dose of insulin.   HOW TO MANAGE YOUR DIABETES BEFORE AND AFTER SURGERY  Why is it important to control my blood sugar before and after surgery? Improving blood sugar levels before and after surgery helps healing and can limit problems. A way of improving blood sugar control is eating a healthy diet by:  Eating less sugar and carbohydrates  Increasing activity/exercise  Talking with your doctor about reaching your blood sugar goals High blood sugars (greater than  180 mg/dL) can raise your risk of infections and slow your recovery, so you will need to focus on controlling your diabetes during the weeks before surgery. Make sure that the doctor who takes care of your diabetes knows about your planned surgery including the date and location.  How do I manage my blood sugar before surgery? Check your blood sugar at least 4 times a day, starting 2 days before surgery, to make sure that the level is not too high or low.  Check your blood sugar the morning of your surgery when you wake up and every 2 hours until you get to the Short Stay unit.  If your blood sugar is less than 70 mg/dL, you will need to treat for low blood sugar: Do not take insulin. Treat a low blood sugar (less than 70 mg/dL) with  cup of clear juice (cranberry or apple), 4 glucose tablets, OR glucose gel. Recheck blood sugar in 15 minutes after treatment (to make sure it is greater than 70 mg/dL). If your blood sugar is not greater than 70 mg/dL on recheck, call [phone] for further instructions. Report your blood sugar to the short stay nurse when you get to Short Stay.  If you are admitted to the hospital after surgery: Your blood sugar will be checked by the staff and you will probably be given insulin after surgery (instead of oral diabetes medicines) to make sure you have good blood sugar levels. The goal for blood sugar control after surgery is 80-180  mg/dL.   [facility] is not responsible for any belongings or valuables.    Do NOT Smoke (Tobacco/Vaping)  24 hours prior to your procedure  If you use a CPAP at night, you may bring your mask for your overnight stay.   Contacts, glasses, hearing aids, dentures or partials may not be worn into surgery, please bring cases for these belongings   For patients admitted to the hospital, discharge time will be determined by your treatment team.   Patients discharged the day of surgery will not be allowed to drive home, and someone  needs to stay with them for 24 hours.   SURGICAL WAITING ROOM VISITATION Patients having surgery or a procedure may have no more than 2 support people in the waiting area - these visitors may rotate.   Children under the age of 76 must have an adult with them who is not the patient. If the patient needs to stay at the hospital during part of their recovery, the visitor guidelines for inpatient rooms apply. Pre-op nurse will coordinate an appropriate time for 1 support person to accompany patient in pre-op.  This support person may not rotate.   Please refer to [email or web address] for the visitor guidelines for Inpatients (after your surgery is over and you are in a regular room).    Special instructions:    Oral Hygiene is also important to reduce your risk of infection.  Remember - BRUSH YOUR TEETH THE MORNING OF SURGERY WITH YOUR REGULAR TOOTHPASTE     Pre-operative 5 CHG Bath Instructions   You can play a key role in reducing the risk of infection after surgery. Your skin needs to be as free of germs as possible. You can reduce the number of germs on your skin by washing with CHG (chlorhexidine gluconate) soap before surgery. CHG is an antiseptic soap that kills germs and continues to kill germs even after washing.   DO NOT use if you have an allergy to chlorhexidine/CHG or antibacterial soaps. If your skin becomes reddened or irritated, stop using the CHG and notify one of our RNs at [phone].   Please shower with the CHG soap starting 4 days before surgery using the following schedule:     Please keep in mind the following:  DO NOT shave, including legs and underarms, starting the day of your first shower.   You may shave your face at any point before/day of surgery.  Place clean sheets on your bed the day you start using CHG soap. Use a clean washcloth (not used since being washed) for each shower. DO NOT sleep with pets  once you start using the CHG.   CHG Shower Instructions:  If you choose to wash your hair and private area, wash first with your normal shampoo/soap.  After you use shampoo/soap, rinse your hair and body thoroughly to remove shampoo/soap residue.  Turn the water OFF and apply about 3 tablespoons (45 ml) of CHG soap to a CLEAN washcloth.  Apply CHG soap ONLY FROM YOUR NECK DOWN TO YOUR TOES (washing for 3-5 minutes)  DO NOT use CHG soap on face, private areas, open wounds, or sores.  Pay special attention to the area where your surgery is being performed.  If you are having back surgery, having someone wash your back for you may be helpful. Wait 2 minutes after CHG soap is applied, then you may rinse off the CHG soap.  Pat dry with a clean towel  Put on clean clothes/pajamas  If you choose to wear lotion, please use ONLY the CHG-compatible lotions on the back of this paper.     Additional instructions for the day of surgery: DO NOT APPLY any lotions, deodorants, cologne, or perfumes.   Put on clean/comfortable clothes.  Brush your teeth.  Ask your nurse before applying any prescription medications to the skin. Do not wear jewelry or makeup. Do not bring valuables to the hospital. Do not wear nail polish, gel polish, artificial nails, or any other type of covering on natural nails (fingers and toes) If you have artificial nails or gel coating that need to be removed by a nail salon, please have this removed prior to surgery. Artificial nails or gel coating may interfere with anesthesia's ability to adequately monitor your vital signs.     CHG Compatible Lotions   Aveeno Moisturizing lotion  Cetaphil Moisturizing Cream  Cetaphil Moisturizing Lotion  Clairol Herbal Essence Moisturizing Lotion, Dry Skin  Clairol Herbal Essence Moisturizing Lotion, Extra Dry Skin  Clairol Herbal Essence Moisturizing Lotion, Normal Skin  Curel Age Defying Therapeutic Moisturizing Lotion with Alpha  Hydroxy  Curel Extreme Care Body Lotion  Curel Soothing Hands Moisturizing Hand Lotion  Curel Therapeutic Moisturizing Cream, Fragrance-Free  Curel Therapeutic Moisturizing Lotion, Fragrance-Free  Curel Therapeutic Moisturizing Lotion, Original Formula  Eucerin Daily Replenishing Lotion  Eucerin Dry Skin Therapy Plus Alpha Hydroxy Crme  Eucerin Dry Skin Therapy Plus Alpha Hydroxy Lotion  Eucerin Original Crme  Eucerin Original Lotion  Eucerin Plus Crme Eucerin Plus Lotion  Eucerin TriLipid Replenishing Lotion  Keri Anti-Bacterial Hand Lotion  Keri Deep Conditioning Original Lotion Dry Skin Formula Softly Scented  Keri Deep Conditioning Original Lotion, Fragrance Free Sensitive Skin Formula  Keri Lotion Fast Absorbing Fragrance Free Sensitive Skin Formula  Keri Lotion Fast Absorbing Softly Scented Dry Skin Formula  Keri Original Lotion  Keri Skin Renewal Lotion Keri Silky Smooth Lotion  Keri Silky Smooth Sensitive Skin Lotion  Nivea Body Creamy Conditioning Oil  Nivea Body Extra Enriched Lotion  Nivea Body Original Lotion  Nivea Body Sheer Moisturizing Lotion Nivea Crme  Nivea Skin Firming Lotion  NutraDerm 30 Skin Lotion  NutraDerm Skin Lotion  NutraDerm Therapeutic Skin Cream  NutraDerm Therapeutic Skin Lotion  ProShield Protective Hand Cream  Provon moisturizing lotion     If you received a COVID test during your pre-op visit, it is requested that you wear a mask when out in public, stay away from anyone that may not be feeling well, and notify your surgeon if you develop symptoms. If you have been in contact with anyone that has tested positive in the last 10 days, please notify your surgeon.    Please read over the following fact sheets that you were given.

## 2023-09-15 ENCOUNTER — Encounter (HOSPITAL_COMMUNITY): Payer: Self-pay

## 2023-09-15 ENCOUNTER — Encounter (HOSPITAL_COMMUNITY)
Admission: RE | Admit: 2023-09-15 | Discharge: 2023-09-15 | Disposition: A | Discharge status: Home / Self Care | Payer: Medicare PPO | Source: Ambulatory Visit | Attending: Neurological Surgery | Admitting: Neurological Surgery

## 2023-09-15 ENCOUNTER — Other Ambulatory Visit: Payer: Self-pay

## 2023-09-15 VITALS — BP 127/79 | HR 77 | Temp 98.4°F | Resp 17 | Ht 66.0 in | Wt 211.5 lb

## 2023-09-15 DIAGNOSIS — Z01812 Encounter for preprocedural laboratory examination: Secondary | ICD-10-CM | POA: Insufficient documentation

## 2023-09-15 DIAGNOSIS — Z01818 Encounter for other preprocedural examination: Secondary | ICD-10-CM

## 2023-09-15 DIAGNOSIS — E119 Type 2 diabetes mellitus without complications: Secondary | ICD-10-CM | POA: Diagnosis not present

## 2023-09-15 LAB — HEMOGLOBIN A1C
Hgb A1c MFr Bld: 6.3 % — ABNORMAL HIGH (ref 4.8–5.6)
Mean Plasma Glucose: 134.11 mg/dL

## 2023-09-15 LAB — CBC
HCT: 37.9 % (ref 36.0–46.0)
Hemoglobin: 12.5 g/dL (ref 12.0–15.0)
MCH: 31.6 pg (ref 26.0–34.0)
MCHC: 33 g/dL (ref 30.0–36.0)
MCV: 95.7 fL (ref 80.0–100.0)
Platelets: 275 10*3/uL (ref 150–400)
RBC: 3.96 MIL/uL (ref 3.87–5.11)
RDW: 12.3 % (ref 11.5–15.5)
WBC: 7.4 10*3/uL (ref 4.0–10.5)
nRBC: 0 % (ref 0.0–0.2)

## 2023-09-15 LAB — BASIC METABOLIC PANEL
Anion gap: 7 (ref 5–15)
BUN: 14 mg/dL (ref 8–23)
CO2: 26 mmol/L (ref 22–32)
Calcium: 10.1 mg/dL (ref 8.9–10.3)
Chloride: 105 mmol/L (ref 98–111)
Creatinine, Ser: 1.19 mg/dL — ABNORMAL HIGH (ref 0.44–1.00)
GFR, Estimated: 50 mL/min — ABNORMAL LOW (ref >60)
Glucose, Bld: 134 mg/dL — ABNORMAL HIGH (ref 70–99)
Potassium: 4.4 mmol/L (ref 3.5–5.1)
Sodium: 138 mmol/L (ref 135–145)

## 2023-09-15 LAB — SURGICAL PCR SCREEN
MRSA, PCR: NEGATIVE
Staphylococcus aureus: NEGATIVE

## 2023-09-15 LAB — TYPE AND SCREEN
ABO/RH(D): A NEG
Antibody Screen: NEGATIVE

## 2023-09-15 NOTE — Progress Notes (Signed)
PCP - Dr. Fara Chute Cardiologist -   PPM/ICD - denies Device Orders - na Rep Notified - na  Chest x-ray -na  EKG - 01/30/2023 Stress Test -  ECHO -  Cardiac Cath -   Sleep Study - denies CPAP - denies  Type II diabetic Fasting Blood Sugar : 110   Checks Blood Sugar: weekly  Last dose of GLP1 agonist-  Ozempic, hold for 7 days. Last dose no later than 09/14/2023  Blood Thinner Instructions: denies Aspirin Instructions:denies  ERAS Protcol -NPO  COVID TEST- n/a  Anesthesia review: Yes. HTN, DM, CKD  Patient denies shortness of breath, fever, cough and chest pain at PAT appointment   All instructions explained to the patient, with a verbal understanding of the material. Patient agrees to go over the instructions while at home for a better understanding. Patient also instructed to self quarantine after being tested for COVID-19. The opportunity to ask questions was provided.

## 2023-09-16 LAB — GLUCOSE, CAPILLARY: Glucose-Capillary: 130 mg/dL — ABNORMAL HIGH (ref 70–99)

## 2023-09-22 ENCOUNTER — Ambulatory Visit (HOSPITAL_COMMUNITY): Payer: Medicare PPO | Admitting: Physician Assistant

## 2023-09-22 ENCOUNTER — Observation Stay (HOSPITAL_COMMUNITY)
Admission: RE | Admit: 2023-09-22 | Discharge: 2023-09-23 | Disposition: A | Discharge status: Home / Self Care | Payer: Medicare PPO | Attending: Neurological Surgery | Admitting: Neurological Surgery

## 2023-09-22 ENCOUNTER — Other Ambulatory Visit: Payer: Self-pay

## 2023-09-22 ENCOUNTER — Encounter (HOSPITAL_COMMUNITY)
Admission: RE | Disposition: A | Discharge status: Home / Self Care | Payer: Self-pay | Source: Home / Self Care | Attending: Neurological Surgery

## 2023-09-22 ENCOUNTER — Encounter (HOSPITAL_COMMUNITY): Payer: Self-pay | Admitting: Neurological Surgery

## 2023-09-22 ENCOUNTER — Ambulatory Visit (HOSPITAL_BASED_OUTPATIENT_CLINIC_OR_DEPARTMENT_OTHER): Payer: Medicare PPO

## 2023-09-22 ENCOUNTER — Ambulatory Visit (HOSPITAL_COMMUNITY): Payer: Medicare PPO

## 2023-09-22 DIAGNOSIS — M48061 Spinal stenosis, lumbar region without neurogenic claudication: Secondary | ICD-10-CM | POA: Insufficient documentation

## 2023-09-22 DIAGNOSIS — Z01818 Encounter for other preprocedural examination: Secondary | ICD-10-CM

## 2023-09-22 DIAGNOSIS — M4726 Other spondylosis with radiculopathy, lumbar region: Secondary | ICD-10-CM

## 2023-09-22 DIAGNOSIS — M4727 Other spondylosis with radiculopathy, lumbosacral region: Principal | ICD-10-CM | POA: Insufficient documentation

## 2023-09-22 DIAGNOSIS — M79605 Pain in left leg: Secondary | ICD-10-CM | POA: Diagnosis present

## 2023-09-22 DIAGNOSIS — M4807 Spinal stenosis, lumbosacral region: Secondary | ICD-10-CM | POA: Diagnosis not present

## 2023-09-22 DIAGNOSIS — Z7984 Long term (current) use of oral hypoglycemic drugs: Secondary | ICD-10-CM | POA: Diagnosis not present

## 2023-09-22 DIAGNOSIS — Z981 Arthrodesis status: Principal | ICD-10-CM

## 2023-09-22 LAB — GLUCOSE, CAPILLARY
Glucose-Capillary: 134 mg/dL — ABNORMAL HIGH (ref 70–99)
Glucose-Capillary: 113 mg/dL — ABNORMAL HIGH (ref 70–99)
Glucose-Capillary: 112 mg/dL — ABNORMAL HIGH (ref 70–99)
Glucose-Capillary: 155 mg/dL — ABNORMAL HIGH (ref 70–99)
Glucose-Capillary: 152 mg/dL — ABNORMAL HIGH (ref 70–99)
Glucose-Capillary: 194 mg/dL — ABNORMAL HIGH (ref 70–99)

## 2023-09-22 LAB — PROTIME-INR
INR: 1 (ref 0.8–1.2)
Prothrombin Time: 13.8 s (ref 11.4–15.2)

## 2023-09-22 SURGERY — POSTERIOR LUMBAR FUSION 1 LEVEL
Anesthesia: General | Site: Spine Lumbar

## 2023-09-22 MED ORDER — THROMBIN 5000 UNITS EX SOLR: OROMUCOSAL | Status: DC | PRN

## 2023-09-22 MED ORDER — CHLORHEXIDINE GLUCONATE CLOTH 2 % EX PADS: 6.0000 | MEDICATED_PAD | Freq: Once | CUTANEOUS | Status: DC

## 2023-09-22 MED ORDER — FENTANYL CITRATE (PF) 250 MCG/5ML IJ SOLN
INTRAMUSCULAR | Status: AC
  Filled 2023-09-22: qty 5

## 2023-09-22 MED ORDER — PHENYLEPHRINE 80 MCG/ML (10ML) SYRINGE FOR IV PUSH (FOR BLOOD PRESSURE SUPPORT)
PREFILLED_SYRINGE | INTRAVENOUS | Status: DC | PRN
  Administered 2023-09-22 (×3): 80 ug via INTRAVENOUS

## 2023-09-22 MED ORDER — OXYCODONE HCL 5 MG PO TABS
5.0000 mg | ORAL_TABLET | ORAL | Status: DC | PRN
  Administered 2023-09-22 – 2023-09-23 (×4): 5 mg via ORAL
  Filled 2023-09-22 (×3): qty 1

## 2023-09-22 MED ORDER — DROPERIDOL 2.5 MG/ML IJ SOLN: 0.6250 mg | Freq: Once | INTRAMUSCULAR | Status: DC | PRN

## 2023-09-22 MED ORDER — ACETAMINOPHEN 10 MG/ML IV SOLN
INTRAVENOUS | Status: AC
  Filled 2023-09-22: qty 100

## 2023-09-22 MED ORDER — DEXAMETHASONE SODIUM PHOSPHATE 10 MG/ML IJ SOLN
INTRAMUSCULAR | Status: DC | PRN
  Administered 2023-09-22: 10 mg via INTRAVENOUS

## 2023-09-22 MED ORDER — POTASSIUM CHLORIDE IN NACL 20-0.9 MEQ/L-% IV SOLN: INTRAVENOUS | Status: DC

## 2023-09-22 MED ORDER — ACETAMINOPHEN 10 MG/ML IV SOLN
1000.0000 mg | Freq: Once | INTRAVENOUS | Status: DC | PRN
  Administered 2023-09-22: 1000 mg via INTRAVENOUS

## 2023-09-22 MED ORDER — ROCURONIUM BROMIDE 10 MG/ML (PF) SYRINGE
PREFILLED_SYRINGE | INTRAVENOUS | Status: AC
  Filled 2023-09-22: qty 10

## 2023-09-22 MED ORDER — CYCLOBENZAPRINE HCL 10 MG PO TABS
ORAL_TABLET | ORAL | Status: AC
  Filled 2023-09-22: qty 1

## 2023-09-22 MED ORDER — LISINOPRIL 20 MG PO TABS
20.0000 mg | ORAL_TABLET | Freq: Every day | ORAL | Status: DC
  Administered 2023-09-22: 20 mg via ORAL
  Filled 2023-09-22: qty 1

## 2023-09-22 MED ORDER — ROCURONIUM BROMIDE 10 MG/ML (PF) SYRINGE
PREFILLED_SYRINGE | INTRAVENOUS | Status: DC | PRN
  Administered 2023-09-22: 10 mg via INTRAVENOUS
  Administered 2023-09-22: 60 mg via INTRAVENOUS

## 2023-09-22 MED ORDER — SCOPOLAMINE 1 MG/3DAYS TD PT72
MEDICATED_PATCH | TRANSDERMAL | Status: DC | PRN
  Administered 2023-09-22: 1 via TRANSDERMAL

## 2023-09-22 MED ORDER — LIDOCAINE 2% (20 MG/ML) 5 ML SYRINGE
INTRAMUSCULAR | Status: AC
  Filled 2023-09-22: qty 5

## 2023-09-22 MED ORDER — LIDOCAINE 2% (20 MG/ML) 5 ML SYRINGE
INTRAMUSCULAR | Status: DC | PRN
  Administered 2023-09-22: 40 mg via INTRAVENOUS

## 2023-09-22 MED ORDER — SODIUM CHLORIDE 0.9% FLUSH
3.0000 mL | Freq: Two times a day (BID) | INTRAVENOUS | Status: DC
  Administered 2023-09-22: 3 mL via INTRAVENOUS

## 2023-09-22 MED ORDER — CHLORHEXIDINE GLUCONATE 0.12 % MT SOLN
15.0000 mL | Freq: Once | OROMUCOSAL | Status: AC
  Administered 2023-09-22: 15 mL via OROMUCOSAL
  Filled 2023-09-22: qty 15

## 2023-09-22 MED ORDER — METFORMIN HCL ER 500 MG PO TB24
500.0000 mg | ORAL_TABLET | Freq: Two times a day (BID) | ORAL | Status: DC
  Administered 2023-09-22 – 2023-09-23 (×2): 500 mg via ORAL
  Filled 2023-09-22 (×2): qty 1

## 2023-09-22 MED ORDER — ACETAMINOPHEN 500 MG PO TABS
1000.0000 mg | ORAL_TABLET | ORAL | Status: AC
  Administered 2023-09-22: 1000 mg via ORAL
  Filled 2023-09-22: qty 2

## 2023-09-22 MED ORDER — ACETAMINOPHEN 160 MG/5ML PO SOLN: 325.0000 mg | Freq: Once | ORAL | Status: DC | PRN

## 2023-09-22 MED ORDER — HYDROMORPHONE HCL 1 MG/ML IJ SOLN
0.2500 mg | INTRAMUSCULAR | Status: DC | PRN
  Administered 2023-09-22 (×4): 0.5 mg via INTRAVENOUS

## 2023-09-22 MED ORDER — MORPHINE SULFATE (PF) 2 MG/ML IV SOLN: 2.0000 mg | INTRAVENOUS | Status: DC | PRN

## 2023-09-22 MED ORDER — ACETAMINOPHEN 325 MG PO TABS: 325.0000 mg | ORAL_TABLET | Freq: Once | ORAL | Status: DC | PRN

## 2023-09-22 MED ORDER — PROPOFOL 10 MG/ML IV BOLUS
INTRAVENOUS | Status: DC | PRN
  Administered 2023-09-22: 125 ug/kg/min via INTRAVENOUS
  Administered 2023-09-22: 150 mg via INTRAVENOUS

## 2023-09-22 MED ORDER — LACTATED RINGERS IV SOLN: INTRAVENOUS | Status: DC

## 2023-09-22 MED ORDER — OXYCODONE HCL 5 MG PO TABS
ORAL_TABLET | ORAL | Status: AC
  Filled 2023-09-22: qty 1

## 2023-09-22 MED ORDER — ONDANSETRON HCL 4 MG/2ML IJ SOLN
INTRAMUSCULAR | Status: DC | PRN
  Administered 2023-09-22: 4 mg via INTRAVENOUS

## 2023-09-22 MED ORDER — CEFAZOLIN SODIUM-DEXTROSE 2-4 GM/100ML-% IV SOLN
2.0000 g | INTRAVENOUS | Status: AC
  Administered 2023-09-22: 2 g via INTRAVENOUS
  Filled 2023-09-22: qty 100

## 2023-09-22 MED ORDER — SODIUM CHLORIDE 0.9% FLUSH: 3.0000 mL | INTRAVENOUS | Status: DC | PRN

## 2023-09-22 MED ORDER — SENNA 8.6 MG PO TABS
1.0000 | ORAL_TABLET | Freq: Two times a day (BID) | ORAL | Status: DC
  Administered 2023-09-22 – 2023-09-23 (×2): 8.6 mg via ORAL
  Filled 2023-09-22 (×2): qty 1

## 2023-09-22 MED ORDER — ONDANSETRON HCL 4 MG/2ML IJ SOLN
INTRAMUSCULAR | Status: AC
  Filled 2023-09-22: qty 2

## 2023-09-22 MED ORDER — PHENOL 1.4 % MT LIQD: 1.0000 | OROMUCOSAL | Status: DC | PRN

## 2023-09-22 MED ORDER — DEXAMETHASONE SODIUM PHOSPHATE 10 MG/ML IJ SOLN
INTRAMUSCULAR | Status: AC
  Filled 2023-09-22: qty 1

## 2023-09-22 MED ORDER — HYDROMORPHONE HCL 1 MG/ML IJ SOLN
INTRAMUSCULAR | Status: AC
  Filled 2023-09-22: qty 1

## 2023-09-22 MED ORDER — THROMBIN 5000 UNITS EX SOLR
CUTANEOUS | Status: AC
  Filled 2023-09-22: qty 5000

## 2023-09-22 MED ORDER — INSULIN ASPART 100 UNIT/ML IJ SOLN
0.0000 [IU] | Freq: Three times a day (TID) | INTRAMUSCULAR | Status: DC
  Administered 2023-09-23: 2 [IU] via SUBCUTANEOUS

## 2023-09-22 MED ORDER — SURGIPHOR WOUND IRRIGATION SYSTEM - OPTIME: TOPICAL | Status: DC | PRN

## 2023-09-22 MED ORDER — SUGAMMADEX SODIUM 200 MG/2ML IV SOLN
INTRAVENOUS | Status: DC | PRN
  Administered 2023-09-22: 200 mg via INTRAVENOUS

## 2023-09-22 MED ORDER — ONDANSETRON HCL 4 MG PO TABS: 4.0000 mg | ORAL_TABLET | Freq: Four times a day (QID) | ORAL | Status: DC | PRN

## 2023-09-22 MED ORDER — ACETAMINOPHEN 500 MG PO TABS
1000.0000 mg | ORAL_TABLET | Freq: Four times a day (QID) | ORAL | Status: DC
  Administered 2023-09-22 – 2023-09-23 (×3): 1000 mg via ORAL
  Filled 2023-09-22 (×4): qty 2

## 2023-09-22 MED ORDER — SUCCINYLCHOLINE CHLORIDE 200 MG/10ML IV SOSY
PREFILLED_SYRINGE | INTRAVENOUS | Status: AC
  Filled 2023-09-22: qty 10

## 2023-09-22 MED ORDER — ONDANSETRON HCL 4 MG/2ML IJ SOLN: 4.0000 mg | Freq: Four times a day (QID) | INTRAMUSCULAR | Status: DC | PRN

## 2023-09-22 MED ORDER — PHENYLEPHRINE HCL-NACL 20-0.9 MG/250ML-% IV SOLN
INTRAVENOUS | Status: DC | PRN
  Administered 2023-09-22: 30 ug/min via INTRAVENOUS

## 2023-09-22 MED ORDER — BUPIVACAINE HCL (PF) 0.25 % IJ SOLN
INTRAMUSCULAR | Status: DC | PRN
  Administered 2023-09-22: 10 mL

## 2023-09-22 MED ORDER — ACETAMINOPHEN 650 MG RE SUPP: 650.0000 mg | RECTAL | Status: DC | PRN

## 2023-09-22 MED ORDER — THROMBIN 20000 UNITS EX SOLR: CUTANEOUS | Status: DC | PRN

## 2023-09-22 MED ORDER — FENTANYL CITRATE (PF) 250 MCG/5ML IJ SOLN
INTRAMUSCULAR | Status: DC | PRN
  Administered 2023-09-22 (×2): 50 ug via INTRAVENOUS

## 2023-09-22 MED ORDER — ORAL CARE MOUTH RINSE: 15.0000 mL | Freq: Once | OROMUCOSAL | Status: AC

## 2023-09-22 MED ORDER — CELECOXIB 200 MG PO CAPS
200.0000 mg | ORAL_CAPSULE | Freq: Two times a day (BID) | ORAL | Status: DC
  Administered 2023-09-22 – 2023-09-23 (×2): 200 mg via ORAL
  Filled 2023-09-22 (×2): qty 1

## 2023-09-22 MED ORDER — GABAPENTIN 300 MG PO CAPS
300.0000 mg | ORAL_CAPSULE | ORAL | Status: AC
  Administered 2023-09-22: 300 mg via ORAL
  Filled 2023-09-22: qty 1

## 2023-09-22 MED ORDER — BUPIVACAINE HCL (PF) 0.25 % IJ SOLN
INTRAMUSCULAR | Status: AC
  Filled 2023-09-22: qty 30

## 2023-09-22 MED ORDER — INSULIN ASPART 100 UNIT/ML IJ SOLN: 0.0000 [IU] | INTRAMUSCULAR | Status: DC | PRN

## 2023-09-22 MED ORDER — THROMBIN 20000 UNITS EX SOLR
CUTANEOUS | Status: AC
  Filled 2023-09-22: qty 20000

## 2023-09-22 MED ORDER — ACETAMINOPHEN 325 MG PO TABS: 650.0000 mg | ORAL_TABLET | ORAL | Status: DC | PRN

## 2023-09-22 MED ORDER — PROPOFOL 10 MG/ML IV BOLUS
INTRAVENOUS | Status: AC
  Filled 2023-09-22: qty 20

## 2023-09-22 MED ORDER — SODIUM CHLORIDE 0.9 % IV SOLN: 250.0000 mL | INTRAVENOUS | Status: DC

## 2023-09-22 MED ORDER — MENTHOL 3 MG MT LOZG: 1.0000 | LOZENGE | OROMUCOSAL | Status: DC | PRN

## 2023-09-22 MED ORDER — CEFAZOLIN SODIUM-DEXTROSE 2-4 GM/100ML-% IV SOLN
2.0000 g | Freq: Three times a day (TID) | INTRAVENOUS | Status: AC
  Administered 2023-09-22 – 2023-09-23 (×2): 2 g via INTRAVENOUS
  Filled 2023-09-22 (×2): qty 100

## 2023-09-22 MED ORDER — 0.9 % SODIUM CHLORIDE (POUR BTL) OPTIME
TOPICAL | Status: DC | PRN
  Administered 2023-09-22: 1000 mL

## 2023-09-22 MED ORDER — CYCLOBENZAPRINE HCL 5 MG PO TABS
5.0000 mg | ORAL_TABLET | Freq: Three times a day (TID) | ORAL | Status: DC | PRN
  Administered 2023-09-22 – 2023-09-23 (×2): 5 mg via ORAL
  Filled 2023-09-22: qty 1

## 2023-09-22 SURGICAL SUPPLY — 63 items
ADH SKN CLS APL DERMABOND .7 (GAUZE/BANDAGES/DRESSINGS) ×1
APL SKNCLS STERI-STRIP NONHPOA (GAUZE/BANDAGES/DRESSINGS) ×1
BAG COUNTER SPONGE SURGICOUNT (BAG) ×1 IMPLANT
BAG SPNG CNTER NS LX DISP (BAG) ×1
BASKET BONE COLLECTION (BASKET) ×1 IMPLANT
BENZOIN TINCTURE PRP APPL 2/3 (GAUZE/BANDAGES/DRESSINGS) ×1 IMPLANT
BLADE BONE MILL MEDIUM (MISCELLANEOUS) ×1 IMPLANT
BLADE CLIPPER SURG (BLADE) IMPLANT
BUR CARBIDE MATCH 3.0 (BURR) ×1 IMPLANT
CANISTER SUCT 3000ML PPV (MISCELLANEOUS) ×1 IMPLANT
CNTNR URN SCR LID CUP LEK RST (MISCELLANEOUS) ×1 IMPLANT
CONT SPEC 4OZ STRL OR WHT (MISCELLANEOUS) ×1
COVER BACK TABLE 60X90IN (DRAPES) ×1 IMPLANT
DERMABOND ADVANCED .7 DNX12 (GAUZE/BANDAGES/DRESSINGS) ×1 IMPLANT
DRAPE C-ARM 42X72 X-RAY (DRAPES) ×2 IMPLANT
DRAPE C-ARMOR (DRAPES) ×1 IMPLANT
DRAPE LAPAROTOMY 100X72X124 (DRAPES) ×1 IMPLANT
DRAPE SURG 17X23 STRL (DRAPES) ×1 IMPLANT
DRSG OPSITE POSTOP 4X6 (GAUZE/BANDAGES/DRESSINGS) IMPLANT
DURAPREP 26ML APPLICATOR (WOUND CARE) ×1 IMPLANT
ELECT REM PT RETURN 9FT ADLT (ELECTROSURGICAL) ×1
ELECTRODE REM PT RTRN 9FT ADLT (ELECTROSURGICAL) ×1 IMPLANT
EVACUATOR 1/8 PVC DRAIN (DRAIN) ×1 IMPLANT
GAUZE 4X4 16PLY ~~LOC~~+RFID DBL (SPONGE) IMPLANT
GLOVE BIO SURGEON STRL SZ7 (GLOVE) IMPLANT
GLOVE BIO SURGEON STRL SZ8 (GLOVE) ×2 IMPLANT
GLOVE BIOGEL PI IND STRL 7.0 (GLOVE) IMPLANT
GOWN STRL REUS W/ TWL LRG LVL3 (GOWN DISPOSABLE) IMPLANT
GOWN STRL REUS W/ TWL XL LVL3 (GOWN DISPOSABLE) ×2 IMPLANT
GOWN STRL REUS W/TWL 2XL LVL3 (GOWN DISPOSABLE) IMPLANT
GOWN STRL REUS W/TWL LRG LVL3 (GOWN DISPOSABLE)
GOWN STRL REUS W/TWL XL LVL3 (GOWN DISPOSABLE) ×2
GRAFT BONE PROTEIOS LRG 5CC (Orthopedic Implant) IMPLANT
HEMOSTAT POWDER KIT SURGIFOAM (HEMOSTASIS) ×1 IMPLANT
KIT BASIN OR (CUSTOM PROCEDURE TRAY) ×1 IMPLANT
KIT GRAFTMAG DEL NEURO DISP (NEUROSURGERY SUPPLIES) IMPLANT
KIT POSITION SURG JACKSON T1 (MISCELLANEOUS) ×1 IMPLANT
KIT TURNOVER KIT B (KITS) ×1 IMPLANT
MATRIX STRIP NEOCORE 12C (Putty) IMPLANT
MILL BONE PREP (MISCELLANEOUS) ×1 IMPLANT
NDL HYPO 25X1 1.5 SAFETY (NEEDLE) ×1 IMPLANT
NEEDLE HYPO 25X1 1.5 SAFETY (NEEDLE) ×1 IMPLANT
NS IRRIG 1000ML POUR BTL (IV SOLUTION) ×1 IMPLANT
PACK LAMINECTOMY NEURO (CUSTOM PROCEDURE TRAY) ×1 IMPLANT
PAD ARMBOARD 7.5X6 YLW CONV (MISCELLANEOUS) ×3 IMPLANT
ROD LORD LIPPED TI 5.5X60 (Rod) IMPLANT
ROD LORD LIPPED TI 5.5X65 (Rod) IMPLANT
SCREW KODIAK 6.5X45 (Screw) IMPLANT
SET SCREW (Screw) ×6 IMPLANT
SET SCREW SPNE (Screw) IMPLANT
SOLUTION IRRIG SURGIPHOR (IV SOLUTION) ×1 IMPLANT
SPACER IDENTITI 9X9X25 5D (Spacer) IMPLANT
SPONGE SURGIFOAM ABS GEL 100 (HEMOSTASIS) ×1 IMPLANT
SPONGE T-LAP 4X18 ~~LOC~~+RFID (SPONGE) IMPLANT
STRIP CLOSURE SKIN 1/2X4 (GAUZE/BANDAGES/DRESSINGS) ×2 IMPLANT
SUT VIC AB 0 CT1 18XCR BRD8 (SUTURE) ×1 IMPLANT
SUT VIC AB 0 CT1 8-18 (SUTURE) ×1
SUT VIC AB 2-0 CP2 18 (SUTURE) ×1 IMPLANT
SUT VIC AB 3-0 SH 8-18 (SUTURE) ×2 IMPLANT
TOWEL GREEN STERILE (TOWEL DISPOSABLE) ×1 IMPLANT
TOWEL GREEN STERILE FF (TOWEL DISPOSABLE) ×1 IMPLANT
TRAY FOLEY MTR SLVR 16FR STAT (SET/KITS/TRAYS/PACK) ×1 IMPLANT
WATER STERILE IRR 1000ML POUR (IV SOLUTION) ×1 IMPLANT

## 2023-09-22 NOTE — Transfer of Care (Signed)
Immediate Anesthesia Transfer of Care Note  Patient: Cindy Mcknight  Procedure(s) Performed: LUMBAR FIVE-SACRAL ONE POSTERIOR LUMBAR INTERBODY FUSION WITH EXTENSION OF LUMBAR FOUR-FIVE FUSION (Spine Lumbar)  Patient Location: PACU  Anesthesia Type:General  Level of Consciousness: awake, alert , and oriented  Airway & Oxygen Therapy: Patient Spontanous Breathing and Patient connected to face mask oxygen  Post-op Assessment: Report given to RN and Post -op Vital signs reviewed and stable  Post vital signs: Reviewed and stable  Last Vitals:  Vitals Value Taken Time  BP 115/70 09/22/23 1528  Temp 97.6   Pulse 96 09/22/23 1530  Resp 28 09/22/23 1530  SpO2 97 % 09/22/23 1530  Vitals shown include unfiled device data.  Last Pain:  Vitals:   09/22/23 0832  PainSc: 4       Patients Stated Pain Goal: 1 (09/22/23 9147)  Complications: No notable events documented.

## 2023-09-22 NOTE — Anesthesia Postprocedure Evaluation (Signed)
Anesthesia Post Note  Patient: Cindy Mcknight  Procedure(s) Performed: LUMBAR FIVE-SACRAL ONE POSTERIOR LUMBAR INTERBODY FUSION WITH EXTENSION OF LUMBAR FOUR-FIVE FUSION (Spine Lumbar)     Patient location during evaluation: PACU Anesthesia Type: General Level of consciousness: awake and alert Pain management: pain level controlled Vital Signs Assessment: post-procedure vital signs reviewed and stable Respiratory status: spontaneous breathing, nonlabored ventilation, respiratory function stable and patient connected to nasal cannula oxygen Cardiovascular status: blood pressure returned to baseline and stable Postop Assessment: no apparent nausea or vomiting Anesthetic complications: no  No notable events documented.  Last Vitals:  Vitals:   09/22/23 1630 09/22/23 1704  BP: 100/61 113/64  Pulse: 72 62  Resp: 14 18  Temp: 36.4 C 36.6 C  SpO2: 95% 93%    Last Pain:  Vitals:   09/22/23 1718  PainSc: 4                  Shelton Silvas

## 2023-09-22 NOTE — Anesthesia Preprocedure Evaluation (Addendum)
Anesthesia Evaluation  Patient identified by MRN, date of birth, ID band Patient awake    Reviewed: Allergy & Precautions, NPO status , Patient's Chart, lab work & pertinent test results  Airway Mallampati: III  TM Distance: >3 FB Neck ROM: Full    Dental  (+) Caps, Dental Advisory Given   Pulmonary neg pulmonary ROS   breath sounds clear to auscultation       Cardiovascular hypertension, Pt. on medications  Rhythm:Regular Rate:Normal     Neuro/Psych negative neurological ROS  negative psych ROS   GI/Hepatic negative GI ROS, Neg liver ROS,,,  Endo/Other  diabetes, Type 2, Oral Hypoglycemic Agents    Renal/GU Renal InsufficiencyRenal disease     Musculoskeletal  (+) Arthritis ,    Abdominal   Peds  Hematology negative hematology ROS (+)   Anesthesia Other Findings   Reproductive/Obstetrics                             Anesthesia Physical Anesthesia Plan  ASA: 2  Anesthesia Plan: General   Post-op Pain Management: Gabapentin PO (pre-op)* and Tylenol PO (pre-op)*   Induction: Intravenous  PONV Risk Score and Plan: 4 or greater and Ondansetron, Dexamethasone, Midazolam and Scopolamine patch - Pre-op  Airway Management Planned: Oral ETT  Additional Equipment: None  Intra-op Plan:   Post-operative Plan: Extubation in OR  Informed Consent: I have reviewed the patients History and Physical, chart, labs and discussed the procedure including the risks, benefits and alternatives for the proposed anesthesia with the patient or authorized representative who has indicated his/her understanding and acceptance.     Dental advisory given  Plan Discussed with: CRNA  Anesthesia Plan Comments: (Possible Glidescope)       Anesthesia Quick Evaluation

## 2023-09-22 NOTE — Op Note (Signed)
09/22/2023  3:16 PM  PATIENT:  Cindy Mcknight  67 y.o. female  PRE-OPERATIVE DIAGNOSIS: Adjacent level spondylosis at L5-S1 with severe left neuroforaminal stenosis with left L5 radiculopathy  POST-OPERATIVE DIAGNOSIS:  same  PROCEDURE:   1. Decompressive lumbar laminectomy, hemi facetectomy and foraminotomies L5-S1 requiring more work than would be required for a simple exposure of the disk for PLIF in order to adequately decompress the neural elements and address the spinal stenosis 2. Posterior lumbar interbody fusion L5 S1 using PTI interbody cages packed with morcellized allograft and autograft  3. Posterior fixation L4-S1 inclusive using Alphatec cortical pedicle screws.  4. Intertransverse arthrodesis L5-S1 using morcellized autograft and allograft. 5.  Removal of nonsegmental instrumentation L4-5  SURGEON:  Marikay Alar, MD  ASSISTANTS: Verlin Dike, FNP  ANESTHESIA:  General  EBL: 50 ml  Total I/O In: 750 [I.V.:650; IV Piggyback:100] Out: 50 [Blood:50]  BLOOD ADMINISTERED:none  DRAINS: none   INDICATION FOR PROCEDURE: This patient presented with severe left leg pain in an L5 distribution. Imaging revealed severe foraminal stenosis at L5-S1 on the left below the previous successful L4-5 fusion. The patient tried a reasonable attempt at conservative medical measures without relief. I recommended decompression and instrumented fusion to address the stenosis as well as the segmental  instability.  Patient understood the risks, benefits, and alternatives and potential outcomes and wished to proceed.  PROCEDURE DETAILS:  The patient was brought to the operating room. After induction of generalized endotracheal anesthesia the patient was rolled into the prone position on chest rolls and all pressure points were padded. The patient's lumbar region was cleaned and then prepped with DuraPrep and draped in the usual sterile fashion. Anesthesia was injected and then a dorsal midline  incision was made and carried down to the lumbosacral fascia. The fascia was opened and the paraspinous musculature was taken down in a subperiosteal fashion to expose the previously placed instrumentation as well as L5-S1 bilaterally. A self-retaining retractor was placed. Intraoperative fluoroscopy confirmed my level.  I removed the locking caps from the pedicle screws and remove the rods.  The pedicle screws had excellent purchase.   I then turned my attention to the decompression and complete lumbar laminectomies, hemi- facetectomies, and foraminotomies were performed at L5-S1.  My nurse practitioner was directly involved in the decompression and exposure of the neural elements. the patient had significant spinal stenosis and this required more work than would be required for a simple exposure of the disc for posterior lumbar interbody fusion which would only require a limited laminotomy. Much more generous decompression and generous foraminotomy was undertaken in order to adequately decompress the neural elements and address the patient's leg pain. The yellow ligament was removed to expose the underlying dura and nerve roots, and generous foraminotomies were performed to adequately decompress the neural elements. Both the exiting and traversing nerve roots were decompressed on both sides until a coronary dilator passed easily along the nerve roots. Once the decompression was complete, I turned my attention to the posterior lower lumbar interbody fusion. The epidural venous vasculature was coagulated and cut sharply. Disc space was incised and the initial discectomy was performed with pituitary rongeurs. The disc space was distracted with sequential distractors to a height of 9 mm. We then used a series of scrapers and shavers to prepare the endplates for fusion. The midline was prepared with Epstein curettes. Once the complete discectomy was finished, we packed an appropriate sized interbody cage with local  autograft and morcellized allograft,  gently retracted the nerve root, and tapped the cage into position at L5-S1.  The midline between the cages was packed with morselized autograft and allograft.   We then turned our attention to the placement of the lower pedicle screws. The pedicle screw entry zones were identified utilizing surface landmarks and fluoroscopy. I drilled into each pedicle utilizing the hand drill, and tapped each pedicle with the appropriate tap. We palpated with a ball probe to assure no break in the cortex. We then placed a 6.5 x 45 mm pedicle screws into the pedicles bilaterally at S1.  My nurse practitioner assisted in placement of the pedicle screws.  We then decorticated the transverse processes and laid a mixture of morcellized autograft and allograft out over these to perform intertransverse arthrodesis at L5-S1. We then placed lordotic rods into the multiaxial screw heads of the pedicle screws L4-S1 inclusive and locked these in position with the locking caps and anti-torque device. We then checked our construct with AP and lateral fluoroscopy. Irrigated with copious amounts of 0.5% povidone iodine solution followed by saline solution. Inspected the nerve roots once again to assure adequate decompression, lined to the dura with Gelfoam,  and then we closed the muscle and the fascia with 0 Vicryl. Closed the subcutaneous tissues with 2-0 Vicryl and subcuticular tissues with 3-0 Vicryl. The skin was closed with benzoin and Steri-Strips. Dressing was then applied, the patient was awakened from general anesthesia and transported to the recovery room in stable condition. At the end of the procedure all sponge, needle and instrument counts were correct.   PLAN OF CARE: admit to inpatient  PATIENT DISPOSITION:  PACU - hemodynamically stable.   Delay start of Pharmacological VTE agent (>24hrs) due to surgical blood loss or risk of bleeding:  yes

## 2023-09-22 NOTE — H&P (Signed)
Subjective: Patient is a 67 y.o. female admitted for L leg pain. Onset of symptoms was several months ago, gradually worsening since that time.  The pain is rated intense, and is located at the across the lower back and radiates to LLE. The pain is described as aching, stabbing, and throbbing and occurs all day. The symptoms have been progressive. Symptoms are exacerbated by standing. MRI or CT showed foraminal stenosis L5-S1 below previous PLIF   Past Medical History:  Diagnosis Date   Arthritis    Chronic renal insufficiency    Diabetes mellitus    Type 2   Fainting    on occasion when she see needles   Hypertension    Obesity    Syncope and collapse 12/22/2015    Past Surgical History:  Procedure Laterality Date   BREAST BIOPSY  2004   results negative   CATARACT EXTRACTION Bilateral    CESAREAN SECTION  12/16/1986   cesect     COLONOSCOPY  06/08/2012   Procedure: COLONOSCOPY;  Surgeon: Corbin Ade, MD;  Location: [facility];  Service: Endoscopy;  Laterality: N/A;  10:45 AM   EXPLORATORY LAPAROTOMY  12/17/1995   SEPTOPLASTY  12/16/2002   TUBAL LIGATION  1997    Prior to Admission medications   Medication Sig Start Date End Date Taking? Authorizing Provider  acetaminophen (TYLENOL) 500 MG tablet Take 500 mg by mouth every 6 (six) hours as needed for moderate pain.   Yes [provider]  Cholecalciferol (VITAMIN D3) 50 MCG (2000 UT) CAPS Take 2,000 Units by mouth daily.   Yes [provider]  lisinopril (PRINIVIL,ZESTRIL) 20 MG tablet Take 20 mg by mouth daily.   Yes [provider]  metFORMIN (GLUCOPHAGE-XR) 500 MG 24 hr tablet Take 500 mg by mouth 2 (two) times daily with a meal.   Yes [provider]  OZEMPIC, 1 MG/DOSE, 4 MG/3ML SOPN Inject 1 mg into the skin once a week.   Yes [provider]  rosuvastatin (CRESTOR) 5 MG tablet Take 2.5 mg by mouth every other day. 08/21/23  Yes [provider]  Calcium  Carb-Cholecalciferol (CALCIUM 600+D3 PO) Take 1 tablet by mouth daily.    [provider]  cyclobenzaprine (FLEXERIL) 5 MG tablet Take 1 tablet (5 mg total) by mouth 3 (three) times daily as needed for muscle spasms. Patient not taking: Reported on 09/08/2023 02/08/23   Val Eagle D, NP   No Known Allergies  Social History   Tobacco Use   Smoking status: Never   Smokeless tobacco: Never  Substance Use Topics   Alcohol use: No    Family History  Problem Relation Age of Onset   Diabetes Mother    Syncope episode Mother    Cancer Father        prostate   Syncope episode Brother    Syncope episode Brother      Review of Systems  Positive ROS: neg  All other systems have been reviewed and were otherwise negative with the exception of those mentioned in the HPI and as above.  Objective: Vital signs in last 24 hours: Temp:  [97.9 F (36.6 C)] 97.9 F (36.6 C) (10/07 0812) Pulse Rate:  [97] 97 (10/07 0812) Resp:  [20] 20 (10/07 0812) BP: (136)/(70) 136/70 (10/07 0812) SpO2:  [98 %] 98 % (10/07 0812) Weight:  [95.7 kg] 95.7 kg (10/07 0812)  General Appearance: Alert, cooperative, no distress, appears stated age Head: Normocephalic, without obvious abnormality, atraumatic Eyes:  PERRL, conjunctiva/corneas clear, EOM's intact    Neck: Supple, symmetrical, trachea midline Back: Symmetric, no curvature, ROM normal, no CVA tenderness Lungs:  respirations unlabored Heart: Regular rate and rhythm Abdomen: Soft, non-tender Extremities: Extremities normal, atraumatic, no cyanosis or edema Pulses: 2+ and symmetric all extremities Skin: Skin color, texture, turgor normal, no rashes or lesions  NEUROLOGIC:   Mental status: Alert and oriented x4,  no aphasia, good attention span, fund of knowledge, and memory Motor Exam - grossly normal Sensory Exam - grossly normal Reflexes: 1= Coordination - grossly normal Gait - grossly normal Balance - grossly normal Cranial  Nerves: I: smell Not tested  II: visual acuity  OS: nl    OD: nl  II: visual fields Full to confrontation  II: pupils Equal, round, reactive to light  III,VII: ptosis None  III,IV,VI: extraocular muscles  Full ROM  V: mastication Normal  V: facial light touch sensation  Normal  V,VII: corneal reflex  Present  VII: facial muscle function - upper  Normal  VII: facial muscle function - lower Normal  VIII: hearing Not tested  IX: soft palate elevation  Normal  IX,X: gag reflex Present  XI: trapezius strength  5/5  XI: sternocleidomastoid strength 5/5  XI: neck flexion strength  5/5  XII: tongue strength  Normal    Data Review Lab Results  Component Value Date   WBC 7.4 09/15/2023   HGB 12.5 09/15/2023   HCT 37.9 09/15/2023   MCV 95.7 09/15/2023   PLT 275 09/15/2023   Lab Results  Component Value Date   NA 138 09/15/2023   K 4.4 09/15/2023   CL 105 09/15/2023   CO2 26 09/15/2023   BUN 14 09/15/2023   CREATININE 1.19 (H) 09/15/2023   GLUCOSE 134 (H) 09/15/2023   Lab Results  Component Value Date   INR 1.0 09/22/2023    Assessment/Plan:  Estimated body mass index is 34.06 kg/m as calculated from the following:   Height as of this encounter: 5\' 6"  (1.676 m).   Weight as of this encounter: 95.7 kg. Patient admitted for PLIF L5-S1. Patient has failed a reasonable attempt at conservative therapy.  I explained the condition and procedure to the patient and answered any questions.  Patient wishes to proceed with procedure as planned. Understands risks/ benefits and typical outcomes of procedure.   Tia Alert 09/22/2023 10:31 AM

## 2023-09-22 NOTE — Anesthesia Procedure Notes (Signed)
Procedure Name: Intubation Date/Time: 09/22/2023 1:29 PM  Performed by: Pincus Large, CRNAPre-anesthesia Checklist: Patient identified, Emergency Drugs available, Suction available and Patient being monitored Patient Re-evaluated:Patient Re-evaluated prior to induction Oxygen Delivery Method: Circle System Utilized Preoxygenation: Pre-oxygenation with 100% oxygen Induction Type: IV induction Ventilation: Mask ventilation without difficulty Laryngoscope Size: Mac and 3 Grade View: Grade II Tube type: Oral Tube size: 7.0 mm Number of attempts: 1 Airway Equipment and Method: Stylet and Oral airway Placement Confirmation: ETT inserted through vocal cords under direct vision, positive ETCO2 and breath sounds checked- equal and bilateral Secured at: 21 cm Tube secured with: Tape Dental Injury: Teeth and Oropharynx as per pre-operative assessment

## 2023-09-23 ENCOUNTER — Other Ambulatory Visit (HOSPITAL_COMMUNITY): Payer: Self-pay

## 2023-09-23 DIAGNOSIS — M4727 Other spondylosis with radiculopathy, lumbosacral region: Secondary | ICD-10-CM | POA: Diagnosis not present

## 2023-09-23 DIAGNOSIS — M48061 Spinal stenosis, lumbar region without neurogenic claudication: Secondary | ICD-10-CM | POA: Diagnosis not present

## 2023-09-23 DIAGNOSIS — Z7984 Long term (current) use of oral hypoglycemic drugs: Secondary | ICD-10-CM | POA: Diagnosis not present

## 2023-09-23 LAB — GLUCOSE, CAPILLARY: Glucose-Capillary: 145 mg/dL — ABNORMAL HIGH (ref 70–99)

## 2023-09-23 MED ORDER — SODIUM CHLORIDE 0.9% FLUSH: 3.0000 mL | Freq: Two times a day (BID) | INTRAVENOUS | Status: DC

## 2023-09-23 MED ORDER — OXYCODONE-ACETAMINOPHEN 5-325 MG PO TABS
1.0000 | ORAL_TABLET | ORAL | 0 refills | Status: DC | PRN
Start: 2023-09-23 — End: 2024-01-06
  Filled 2023-09-23: qty 30, 5d supply, fill #0

## 2023-09-23 MED ORDER — CYCLOBENZAPRINE HCL 5 MG PO TABS
5.0000 mg | ORAL_TABLET | Freq: Three times a day (TID) | ORAL | 0 refills | Status: DC | PRN
  Filled 2023-09-23: qty 30, 10d supply, fill #0
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
## 2023-09-23 NOTE — Discharge Summary (Signed)
Physician Discharge Summary  Patient ID: Cindy Mcknight MRN: [id number] DOB/AGE: [birth year] 67 y.o.  Admit date: 09/22/2023 Discharge date: 09/23/2023  Admission Diagnoses: lumbar radiculopathy    Discharge Diagnoses: same   Discharged Condition: good  Hospital Course: The patient was admitted on 09/22/2023 and taken to the operating room where the patient underwent PLIF L5-S1. The patient tolerated the procedure well and was taken to the recovery room and then to the floor in stable condition. The hospital course was routine. There were no complications. The wound remained clean dry and intact. Pt had appropriate back soreness. No complaints of leg pain or new N/T/W. The patient remained afebrile with stable vital signs, and tolerated a regular diet. The patient continued to increase activities, and pain was well controlled with oral pain medications.   Consults: None  Significant Diagnostic Studies:  Results for orders placed or performed during the hospital encounter of 09/22/23  Protime-INR  Result Value Ref Range   Prothrombin Time 13.8 11.4 - 15.2 seconds   INR 1.0 0.8 - 1.2  Glucose, capillary  Result Value Ref Range   Glucose-Capillary 134 (H) 70 - 99 mg/dL  Glucose, capillary  Result Value Ref Range   Glucose-Capillary 113 (H) 70 - 99 mg/dL  Glucose, capillary  Result Value Ref Range   Glucose-Capillary 112 (H) 70 - 99 mg/dL  Glucose, capillary  Result Value Ref Range   Glucose-Capillary 155 (H) 70 - 99 mg/dL  Glucose, capillary  Result Value Ref Range   Glucose-Capillary 152 (H) 70 - 99 mg/dL  Glucose, capillary  Result Value Ref Range   Glucose-Capillary 194 (H) 70 - 99 mg/dL   Comment 1 Notify RN    Comment 2 Document in Chart   Glucose, capillary  Result Value Ref Range   Glucose-Capillary 145 (H) 70 - 99 mg/dL   Comment 1 Notify RN    Comment 2 Document in Chart     DG Lumbar Spine 2-3 Views  Result Date: 09/22/2023 CLINICAL DATA:  Elective surgery.  EXAM: LUMBAR SPINE - 2-3 VIEW COMPARISON:  None Available. FINDINGS: Two fluoroscopic spot views of the lumbar spine obtained in the operating room. Posterior rod with intrapedicular screw fusion L4 through S1 with interbody spacers. Fluoroscopy time 24.2 seconds. Dose 21.87 mGy. IMPRESSION: Intraoperative fluoroscopy during lumbar fusion. Electronically Signed   By: Narda Rutherford M.D.   On: 09/22/2023 19:21   DG C-Arm 1-60 Min-No Report  Result Date: 09/22/2023 Fluoroscopy was utilized by the requesting physician.  No radiographic interpretation.   DG C-Arm 1-60 Min-No Report  Result Date: 09/22/2023 Fluoroscopy was utilized by the requesting physician.  No radiographic interpretation.   DG MYELOGRAPHY LUMBAR INJ LUMBOSACRAL  Result Date: 08/25/2023 CLINICAL DATA:  Previous instrumented fusion L4-5. persistent left L5 radicular pain, numbness and tingling. Diabetes. EXAM: LUMBAR MYELOGRAM CT LUMBAR SPINE WITH INTRATHECAL CONTRAST FLUOROSCOPY: Radiation Exposure Index (as provided by the fluoroscopic device): 11.2 mGy air Kerma TECHNIQUE: The procedure, risks (including but not limited to bleeding, infection, organ damage ), benefits, and alternatives were explained to the patient. Questions regarding the procedure were encouraged and answered. The patient understands and consents to the procedure. An appropriate entry site was determined under fluoroscopy. Operator donned sterile gloves and mask. Skin site was marked, prepped with Betadine, and draped in usual sterile fashion, and infiltrated locally with 1% lidocaine. A 22 gauge spinal needle was advanced into the thecal sac at L3 from a left parasagittal approach. Clear colorless CSF returned. 17 ml  Omnipaque 180 were administered intrathecally for lumbar myelography, followed by axial CT scanning of the lumbar spine. I personally performed the lumbar puncture and administered the intrathecal contrast. I also personally supervised acquisition of the  myelogram images. Coronal and sagittal reconstructions were generated from the axial scan. This exam was performed according to the departmental dose-optimization program which includes automated exposure control, adjustment of the mA and/or kV according to patient size and/or use of iterative reconstruction technique. COMPARISON:  MR 06/04/2023 FINDINGS: Lowest rib-bearing segment is T12. Transitional L5 segment, concordant with previous MR numbering scheme. No fracture. No dynamic instability on lateral flexion/extension. T12-L1: Conus terminates behind L1. Small central disc protrusion. No spinal stenosis. Foramina patent. L1-2: No disc bulge or protrusion. Central canal and foramina patent. L2-3: Narrowing of the interspace with central vacuum phenomenon. Broad circumferential disc bulge right greater than left with mild spinal stenosis and bilateral foraminal encroachment right worse than left. L3-4: Moderate circumferential disc bulge. Previous left laminotomy. Mild bilateral facet DJD contributing to mild central canal stenosis and bilateral foraminal encroachment. L4-5: Bilateral pedicle screws at each level with vertical interconnecting hardware, intact without surrounding lucency. Cage and graft material in the interspace without significant subsidence. Central canal is capacious. Foramina patent. L5-S1: Moderate narrowing of the interspace with vacuum phenomenon. Circumferential disc bulge with endplate spurring. Bilateral facet DJD with foraminal encroachment bilaterally. No paraspinal hematoma or significant fluid collection. Scattered aortoiliac calcified plaque. Partially calcified gallstone at least 3.2 cm, incompletely visualized. IMPRESSION: 1. Small central disc protrusion T12-L1 without compressive pathology. 2. Mild multifactorial spinal stenosis L2-3 with right greater than left foraminal encroachment. 3. Mild multifactorial spinal stenosis L3-4 with bilateral foraminal encroachment. 4. Postop  changes L4-5 without apparent complication. 5. Disc bulge L5-S1 with bilateral foraminal encroachment. 6. Cholelithiasis. Aortic Atherosclerosis (ICD10-I70.0). Electronically Signed   By: Corlis Leak M.D.   On: 08/25/2023 14:05   CT LUMBAR SPINE W CONTRAST  Result Date: 08/25/2023 CLINICAL DATA:  Previous instrumented fusion L4-5. persistent left L5 radicular pain, numbness and tingling. Diabetes. EXAM: LUMBAR MYELOGRAM CT LUMBAR SPINE WITH INTRATHECAL CONTRAST FLUOROSCOPY: Radiation Exposure Index (as provided by the fluoroscopic device): 11.2 mGy air Kerma TECHNIQUE: The procedure, risks (including but not limited to bleeding, infection, organ damage ), benefits, and alternatives were explained to the patient. Questions regarding the procedure were encouraged and answered. The patient understands and consents to the procedure. An appropriate entry site was determined under fluoroscopy. Operator donned sterile gloves and mask. Skin site was marked, prepped with Betadine, and draped in usual sterile fashion, and infiltrated locally with 1% lidocaine. A 22 gauge spinal needle was advanced into the thecal sac at L3 from a left parasagittal approach. Clear colorless CSF returned. 17 ml Omnipaque 180 were administered intrathecally for lumbar myelography, followed by axial CT scanning of the lumbar spine. I personally performed the lumbar puncture and administered the intrathecal contrast. I also personally supervised acquisition of the myelogram images. Coronal and sagittal reconstructions were generated from the axial scan. This exam was performed according to the departmental dose-optimization program which includes automated exposure control, adjustment of the mA and/or kV according to patient size and/or use of iterative reconstruction technique. COMPARISON:  MR 06/04/2023 FINDINGS: Lowest rib-bearing segment is T12. Transitional L5 segment, concordant with previous MR numbering scheme. No fracture. No dynamic  instability on lateral flexion/extension. T12-L1: Conus terminates behind L1. Small central disc protrusion. No spinal stenosis. Foramina patent. L1-2: No disc bulge or protrusion. Central canal and foramina patent. L2-3:  Narrowing of the interspace with central vacuum phenomenon. Broad circumferential disc bulge right greater than left with mild spinal stenosis and bilateral foraminal encroachment right worse than left. L3-4: Moderate circumferential disc bulge. Previous left laminotomy. Mild bilateral facet DJD contributing to mild central canal stenosis and bilateral foraminal encroachment. L4-5: Bilateral pedicle screws at each level with vertical interconnecting hardware, intact without surrounding lucency. Cage and graft material in the interspace without significant subsidence. Central canal is capacious. Foramina patent. L5-S1: Moderate narrowing of the interspace with vacuum phenomenon. Circumferential disc bulge with endplate spurring. Bilateral facet DJD with foraminal encroachment bilaterally. No paraspinal hematoma or significant fluid collection. Scattered aortoiliac calcified plaque. Partially calcified gallstone at least 3.2 cm, incompletely visualized. IMPRESSION: 1. Small central disc protrusion T12-L1 without compressive pathology. 2. Mild multifactorial spinal stenosis L2-3 with right greater than left foraminal encroachment. 3. Mild multifactorial spinal stenosis L3-4 with bilateral foraminal encroachment. 4. Postop changes L4-5 without apparent complication. 5. Disc bulge L5-S1 with bilateral foraminal encroachment. 6. Cholelithiasis. Aortic Atherosclerosis (ICD10-I70.0). Electronically Signed   By: Corlis Leak M.D.   On: 08/25/2023 14:05    Antibiotics:  Anti-infectives (From admission, onward)    Start     Dose/Rate Route Frequency Ordered Stop   09/22/23 2100  ceFAZolin (ANCEF) IVPB 2g/100 mL premix        2 g 200 mL/hr over 30 Minutes Intravenous Every 8 hours 09/22/23 1711 09/23/23  0515   09/22/23 0815  ceFAZolin (ANCEF) IVPB 2g/100 mL premix        2 g 200 mL/hr over 30 Minutes Intravenous On call to O.R. 09/22/23 0800 09/22/23 1352       Discharge Exam: Blood pressure 91/62, pulse 63, temperature 97.6 F (36.4 C), temperature source Oral, resp. rate 20, height 5\' 6"  (1.676 m), weight 95.7 kg, SpO2 99%. Neurologic: Grossly normal Dressing dry  Discharge Medications:   Allergies as of 09/23/2023   No Known Allergies      Medication List     TAKE these medications    acetaminophen 500 MG tablet Commonly known as: TYLENOL Take 500 mg by mouth every 6 (six) hours as needed for moderate pain.   CALCIUM 600+D3 PO Take 1 tablet by mouth daily.   cyclobenzaprine 5 MG tablet Commonly known as: FLEXERIL Take 1 tablet (5 mg total) by mouth 3 (three) times daily as needed for muscle spasms.   lisinopril 20 MG tablet Commonly known as: ZESTRIL Take 20 mg by mouth daily.   metFORMIN 500 MG 24 hr tablet Commonly known as: GLUCOPHAGE-XR Take 500 mg by mouth 2 (two) times daily with a meal.   oxyCODONE-acetaminophen 5-325 MG tablet Commonly known as: PERCOCET/ROXICET Take 1 tablet by mouth every 4 (four) hours as needed for severe pain.   Ozempic (1 MG/DOSE) 4 MG/3ML Sopn Generic drug: Semaglutide (1 MG/DOSE) Inject 1 mg into the skin once a week.   rosuvastatin 5 MG tablet Commonly known as: CRESTOR Take 2.5 mg by mouth every other day.   Vitamin D3 50 MCG (2000 UT) capsule Take 2,000 Units by mouth daily.               Durable Medical Equipment  (From admission, onward)           Start     Ordered   09/22/23 1712  DME Walker rolling  Once       Question:  Patient needs a walker to treat with the following condition  Answer:  S/P lumbar fusion  09/22/23 1711   09/22/23 1712  DME 3 n 1  Once        09/22/23 1711            Disposition: home  Final Dx: PLIF L5-S1  Discharge Instructions      Remove dressing in 72 hours    Complete by: As directed    Call MD for:   Complete by: As directed    Call MD for:  difficulty breathing, headache or visual disturbances   Complete by: As directed    Call MD for:  hives   Complete by: As directed    Call MD for:  persistant dizziness or light-headedness   Complete by: As directed    Call MD for:  persistant nausea and vomiting   Complete by: As directed    Call MD for:  redness, tenderness, or signs of infection (pain, swelling, redness, odor or green/yellow discharge around incision site)   Complete by: As directed    Call MD for:  severe uncontrolled pain   Complete by: As directed    Call MD for:  temperature >100.4   Complete by: As directed    Diet - low sodium heart healthy   Complete by: As directed    Driving Restrictions   Complete by: As directed    No driving for 2 weeks, no riding in the car for 1 week   Increase activity slowly   Complete by: As directed    Lifting restrictions   Complete by: As directed    No lifting more than 8 lbs          Signed: Tia Alert 09/23/2023, 7:46 AM

## 2023-09-23 NOTE — Progress Notes (Signed)
Patient alert and oriented, mae's well, voiding adequate amount of urine, swallowing without difficulty, no c/o pain at time of discharge. Patient discharged home with family. Script and discharged instructions given to patient. Patient and family stated understanding of instructions given. Patient has an appointment with Dr. Jones in 2 weeks ?

## 2023-09-23 NOTE — Evaluation (Addendum)
Occupational Therapy Evaluation Patient Details Name: Cindy Mcknight MRN: [id number] DOB: [birth year] Today's Date: 09/23/2023   History of Present Illness 67 y/o female who presents s/p 10/7 PLIF L5-S1 PMH arthritis, chronic renal insufficiency DM2, HTN, prior back surgery in Feb syncope.   Clinical Impression   Patient evaluated by Occupational Therapy with no further acute OT needs identified. All education has been completed and the patient has no further questions. See below for any follow-up Occupational Therapy or equipment needs. OT to sign off. Thank you for referral.     *informed RN that pt was concerned about medical coding being correct for bill to have coverage of service and RN reaching out to [facility].     If plan is discharge home, recommend the following: Assist for transportation    Functional Status Assessment  Patient has had a recent decline in their functional status and demonstrates the ability to make significant improvements in function in a reasonable and predictable amount of time.  Equipment Recommendations  None recommended by OT    Recommendations for Other Services       Precautions / Restrictions Precautions Precautions: Fall;Back Precaution Booklet Issued: Yes (comment) Precaution Comments: handout reviewed for adls. Required Braces or Orthoses: Spinal Brace Spinal Brace: Lumbar corset;Applied in sitting position Restrictions Weight Bearing Restrictions: No      Mobility Bed Mobility Overal bed mobility: Needs Assistance Bed Mobility: Rolling, Sidelying to Sit, Sit to Sidelying Rolling: Supervision Sidelying to sit: Supervision     Sit to sidelying: Supervision General bed mobility comments: pt used bed rail and has a bed rail at home    Transfers Overall transfer level: Needs assistance Equipment used: None Transfers: Sit to/from Stand Sit to Stand: Supervision           General transfer comment: Pt demonstrated proper hand  placement on seated surface for safety.      Balance Overall balance assessment: Needs assistance Sitting-balance support: Feet supported, No upper extremity supported Sitting balance-Leahy Scale: Fair     Standing balance support: No upper extremity supported, During functional activity Standing balance-Leahy Scale: Fair                             ADL either performed or assessed with clinical judgement   ADL Overall ADL's : Needs assistance/impaired Eating/Feeding: Independent   Grooming: Wash/dry hands   Upper Body Bathing: Modified independent   Lower Body Bathing: Supervison/ safety       Lower Body Dressing: Supervision/safety Lower Body Dressing Details (indicate cue type and reason): spouse helped with shoes Toilet Transfer: Supervision/safety           Functional mobility during ADLs: Supervision/safety  Back handout provided and reviewed adls in detail. Pt educated on: clothing between brace, never sleep in brace, set an alarm at night for medication, avoid sitting for long periods of time, correct bed positioning for sleeping, correct sequence for bed mobility, avoiding lifting more than 5 pounds and never wash directly over incision. Pt demosntrates figure 4 cross with LB dressing. Spouse (A)ing with dressing without cues from wife. All education is complete and patient indicates understanding.      Vision Baseline Vision/History: 0 No visual deficits Ability to See in Adequate Light: 0 Adequate Patient Visual Report: No change from baseline Vision Assessment?: No apparent visual deficits     Perception         Praxis  Pertinent Vitals/Pain Pain Assessment Pain Assessment: Faces Faces Pain Scale: Hurts little more Pain Location: Incision site Pain Descriptors / Indicators: Operative site guarding, Sore Pain Intervention(s): Limited activity within patient's tolerance, Premedicated before session, Repositioned      Extremity/Trunk Assessment Upper Extremity Assessment Upper Extremity Assessment: Overall WFL for tasks assessed   Lower Extremity Assessment Lower Extremity Assessment: Defer to PT evaluation   Cervical / Trunk Assessment Cervical / Trunk Assessment: Back Surgery   Communication Communication Communication: No apparent difficulties Cueing Techniques: Verbal cues;Gestural cues   Cognition Arousal: Alert Behavior During Therapy: WFL for tasks assessed/performed Overall Cognitive Status: Within Functional Limits for tasks assessed                                       General Comments  incision dry and dressing intact at this time    Exercises     Shoulder Instructions      Home Living Family/patient expects to be discharged to:: Private residence Living Arrangements: Spouse/significant other Available Help at Discharge: Family;Available 24 hours/day Type of Home: House Home Access: Stairs to enter [employer] of Steps: 4 Entrance Stairs-Rails: Right Home Layout: One level     Bathroom Shower/Tub: Producer, television/film/video: Handicapped height     Home Equipment: Toilet riser;Adaptive equipment Adaptive Equipment: Reacher Additional Comments: High Bed, no animals, spouse (A) at baseline with all adls      Prior Functioning/Environment Prior Level of Function : Needs assist             Mobility Comments: No AD ADLs Comments: Pt reports husband has been "showering" her.        OT Problem List:        OT Treatment/Interventions:      OT Goals(Current goals can be found in the care plan section) Acute Rehab OT Goals Patient Stated Goal: to make sure that the code for surgery is the correct code for insurance coverage and approval Potential to Achieve Goals: Good  OT Frequency:      Co-evaluation              AM-PAC OT "6 Clicks" Daily Activity     Outcome Measure Help from another person eating meals?:  None Help from another person taking care of personal grooming?: None Help from another person toileting, which includes using toliet, bedpan, or urinal?: None Help from another person bathing (including washing, rinsing, drying)?: None Help from another person to put on and taking off regular upper body clothing?: None Help from another person to put on and taking off regular lower body clothing?: None 6 Click Score: 24   End of Session Equipment Utilized During Treatment: Back brace Nurse Communication: Mobility status;Precautions  Activity Tolerance: Patient tolerated treatment well Patient left: in bed;with call bell/phone within reach;with family/visitor present  OT Visit Diagnosis: Unsteadiness on feet (R26.81)                Time: 8295-6213 OT Time Calculation (min): 24 min Charges:  OT General Charges $OT Visit: 1 Visit OT Evaluation $OT Eval Moderate Complexity: 1 Mod  Brynn, OTR/L  Acute Rehabilitation Services Office: [phone] .   Mateo Flow 09/23/2023, 11:46 AM

## 2023-10-22 DIAGNOSIS — Z961 Presence of intraocular lens: Secondary | ICD-10-CM | POA: Diagnosis not present

## 2023-10-22 DIAGNOSIS — H524 Presbyopia: Secondary | ICD-10-CM | POA: Diagnosis not present

## 2023-10-22 DIAGNOSIS — E119 Type 2 diabetes mellitus without complications: Secondary | ICD-10-CM | POA: Diagnosis not present

## 2023-11-04 DIAGNOSIS — M5416 Radiculopathy, lumbar region: Secondary | ICD-10-CM | POA: Diagnosis not present

## 2023-11-04 DIAGNOSIS — Z6834 Body mass index (BMI) 34.0-34.9, adult: Secondary | ICD-10-CM | POA: Diagnosis not present

## 2023-12-12 NOTE — Telephone Encounter (Signed)
Pt called and stated she was ready to schedule her colonoscopy  Advised pt that she will need a new referral because it has been 6 months since we received the last one. Verbalized understanding.

## 2023-12-16 DIAGNOSIS — E1165 Type 2 diabetes mellitus with hyperglycemia: Secondary | ICD-10-CM | POA: Diagnosis not present

## 2023-12-16 DIAGNOSIS — E1122 Type 2 diabetes mellitus with diabetic chronic kidney disease: Secondary | ICD-10-CM | POA: Diagnosis not present

## 2023-12-16 DIAGNOSIS — N1831 Chronic kidney disease, stage 3a: Secondary | ICD-10-CM | POA: Diagnosis not present

## 2023-12-16 DIAGNOSIS — E7801 Familial hypercholesterolemia: Secondary | ICD-10-CM | POA: Diagnosis not present

## 2023-12-18 ENCOUNTER — Encounter (INDEPENDENT_AMBULATORY_CARE_PROVIDER_SITE_OTHER): Payer: Self-pay | Admitting: *Deleted

## 2023-12-24 DIAGNOSIS — R7989 Other specified abnormal findings of blood chemistry: Secondary | ICD-10-CM | POA: Diagnosis not present

## 2023-12-24 DIAGNOSIS — I10 Essential (primary) hypertension: Secondary | ICD-10-CM | POA: Diagnosis not present

## 2023-12-24 DIAGNOSIS — Z0001 Encounter for general adult medical examination with abnormal findings: Secondary | ICD-10-CM | POA: Diagnosis not present

## 2023-12-24 DIAGNOSIS — E7801 Familial hypercholesterolemia: Secondary | ICD-10-CM | POA: Diagnosis not present

## 2023-12-24 DIAGNOSIS — M5430 Sciatica, unspecified side: Secondary | ICD-10-CM | POA: Diagnosis not present

## 2023-12-24 DIAGNOSIS — K8020 Calculus of gallbladder without cholecystitis without obstruction: Secondary | ICD-10-CM | POA: Diagnosis not present

## 2023-12-24 DIAGNOSIS — E1122 Type 2 diabetes mellitus with diabetic chronic kidney disease: Secondary | ICD-10-CM | POA: Diagnosis not present

## 2023-12-24 DIAGNOSIS — R5383 Other fatigue: Secondary | ICD-10-CM | POA: Diagnosis not present

## 2023-12-24 DIAGNOSIS — E1121 Type 2 diabetes mellitus with diabetic nephropathy: Secondary | ICD-10-CM | POA: Diagnosis not present

## 2024-01-06 ENCOUNTER — Telehealth: Payer: Self-pay | Admitting: *Deleted

## 2024-01-06 DIAGNOSIS — Z6834 Body mass index (BMI) 34.0-34.9, adult: Secondary | ICD-10-CM | POA: Diagnosis not present

## 2024-01-06 DIAGNOSIS — M5416 Radiculopathy, lumbar region: Secondary | ICD-10-CM | POA: Diagnosis not present

## 2024-01-06 DIAGNOSIS — M5417 Radiculopathy, lumbosacral region: Secondary | ICD-10-CM | POA: Diagnosis not present

## 2024-01-06 NOTE — Telephone Encounter (Signed)
Patient requesting sutabs   Procedure: COLONOSCOPY  Estimated body mass index is 34.06 kg/m as calculated from the following:   Height as of 09/22/23: 5\' 6"  (1.676 m).   Weight as of 09/22/23: 211 lb (95.7 kg).   Have you had a colonoscopy before?  05/2012, Dr. Jena Gauss  Do you have family history of colon cancer?  no  Do you have a family history of polyps? no  Previous colonoscopy with polyps removed? no  Do you have a history colorectal cancer?   no  Are you diabetic?  Yes, type 2 takes ozempic  Do you have a prosthetic or mechanical heart valve? no  Do you have a pacemaker/defibrillator?   no  Have you had endocarditis/atrial fibrillation?  no  Do you use supplemental oxygen/CPAP?  no  Have you had joint replacement within the last 12 months?  2 lower back surgery with rods placed in lower back 2024  Do you tend to be constipated or have to use laxatives?  no   Do you have history of alcohol use? If yes, how much and how often.  no  Do you have history or are you using drugs? If yes, what do are you  using?  no  Have you ever had a stroke/heart attack?  no  Have you ever had a heart or other vascular stent placed,?no  Do you take weight loss medication? no  female patients,: have you had a hysterectomy? no                              are you post menopausal?  yes                              do you still have your menstrual cycle? no    Date of last menstrual period?   Do you take any blood-thinning medications such as: (Plavix, aspirin, Coumadin, Aggrenox, Brilinta, Xarelto, Eliquis, Pradaxa, Savaysa or Effient)? no  If yes we need the name, milligram, dosage and who is prescribing doctor:               Current Outpatient Medications  Medication Sig Dispense Refill   Calcium Carb-Cholecalciferol (CALCIUM 600+D3 PO) Take 1 tablet by mouth daily.     Cholecalciferol (VITAMIN D3) 50 MCG (2000 UT) CAPS Take 2,000 Units by mouth daily.     lisinopril  (PRINIVIL,ZESTRIL) 20 MG tablet Take 20 mg by mouth daily.     metFORMIN (GLUCOPHAGE-XR) 500 MG 24 hr tablet Take 500 mg by mouth 2 (two) times daily with a meal.     OZEMPIC, 1 MG/DOSE, 4 MG/3ML SOPN Inject 1 mg into the skin once a week.     No current facility-administered medications for this visit.    No Known Allergies

## 2024-01-17 DIAGNOSIS — I10 Essential (primary) hypertension: Secondary | ICD-10-CM | POA: Diagnosis not present

## 2024-01-17 DIAGNOSIS — Z6834 Body mass index (BMI) 34.0-34.9, adult: Secondary | ICD-10-CM | POA: Diagnosis not present

## 2024-01-17 DIAGNOSIS — J019 Acute sinusitis, unspecified: Secondary | ICD-10-CM | POA: Diagnosis not present

## 2024-01-23 ENCOUNTER — Encounter (INDEPENDENT_AMBULATORY_CARE_PROVIDER_SITE_OTHER): Payer: Self-pay | Admitting: *Deleted

## 2024-01-23 MED ORDER — SUTAB 1479-225-188 MG PO TABS: ORAL_TABLET | ORAL | 0 refills | Status: DC

## 2024-01-23 NOTE — Telephone Encounter (Signed)
 PT AWARE. INSTRUCTIONS SENT.

## 2024-01-23 NOTE — Telephone Encounter (Signed)
 Referral completed, [facility] apt letter sent to PCP

## 2024-01-23 NOTE — Addendum Note (Signed)
 Addended by: Feliz Hosteller on: 01/23/2024 10:33 AM   Modules accepted: Orders

## 2024-01-23 NOTE — Telephone Encounter (Addendum)
 Called pt. She is scheduled for 3/7 at 1:30pm. Aware will send instructions and rx to pharmacy. She does not want any prep other than sutabs. Reports she can't do those other preps. Dr. Shaaron, please advise if you are okay with this? Thanks!    PA approved via cohere Authorization #[id number], DOS: 02/20/2024 - 04/21/2024

## 2024-01-29 DIAGNOSIS — M6281 Muscle weakness (generalized): Secondary | ICD-10-CM | POA: Diagnosis not present

## 2024-01-29 DIAGNOSIS — Z48811 Encounter for surgical aftercare following surgery on the nervous system: Secondary | ICD-10-CM | POA: Diagnosis not present

## 2024-01-29 DIAGNOSIS — M545 Low back pain, unspecified: Secondary | ICD-10-CM | POA: Diagnosis not present

## 2024-02-02 DIAGNOSIS — Z6835 Body mass index (BMI) 35.0-35.9, adult: Secondary | ICD-10-CM | POA: Diagnosis not present

## 2024-02-02 DIAGNOSIS — M6281 Muscle weakness (generalized): Secondary | ICD-10-CM | POA: Diagnosis not present

## 2024-02-02 DIAGNOSIS — J019 Acute sinusitis, unspecified: Secondary | ICD-10-CM | POA: Diagnosis not present

## 2024-02-02 DIAGNOSIS — Z48811 Encounter for surgical aftercare following surgery on the nervous system: Secondary | ICD-10-CM | POA: Diagnosis not present

## 2024-02-02 DIAGNOSIS — M545 Low back pain, unspecified: Secondary | ICD-10-CM | POA: Diagnosis not present

## 2024-02-05 DIAGNOSIS — J019 Acute sinusitis, unspecified: Secondary | ICD-10-CM | POA: Diagnosis not present

## 2024-02-05 DIAGNOSIS — Z6834 Body mass index (BMI) 34.0-34.9, adult: Secondary | ICD-10-CM | POA: Diagnosis not present

## 2024-02-05 DIAGNOSIS — M6281 Muscle weakness (generalized): Secondary | ICD-10-CM | POA: Diagnosis not present

## 2024-02-05 DIAGNOSIS — M545 Low back pain, unspecified: Secondary | ICD-10-CM | POA: Diagnosis not present

## 2024-02-05 DIAGNOSIS — Z48811 Encounter for surgical aftercare following surgery on the nervous system: Secondary | ICD-10-CM | POA: Diagnosis not present

## 2024-02-11 DIAGNOSIS — Z48811 Encounter for surgical aftercare following surgery on the nervous system: Secondary | ICD-10-CM | POA: Diagnosis not present

## 2024-02-11 DIAGNOSIS — M545 Low back pain, unspecified: Secondary | ICD-10-CM | POA: Diagnosis not present

## 2024-02-11 DIAGNOSIS — M6281 Muscle weakness (generalized): Secondary | ICD-10-CM | POA: Diagnosis not present

## 2024-02-16 DIAGNOSIS — M6281 Muscle weakness (generalized): Secondary | ICD-10-CM | POA: Diagnosis not present

## 2024-02-16 DIAGNOSIS — Z48811 Encounter for surgical aftercare following surgery on the nervous system: Secondary | ICD-10-CM | POA: Diagnosis not present

## 2024-02-16 DIAGNOSIS — M545 Low back pain, unspecified: Secondary | ICD-10-CM | POA: Diagnosis not present

## 2024-02-20 ENCOUNTER — Ambulatory Visit (HOSPITAL_COMMUNITY): Admitting: Anesthesiology

## 2024-02-20 ENCOUNTER — Ambulatory Visit (HOSPITAL_COMMUNITY)
Admission: RE | Admit: 2024-02-20 | Discharge: 2024-02-20 | Disposition: A | Discharge status: Home / Self Care | Payer: Medicare PPO | Attending: Internal Medicine | Admitting: Internal Medicine

## 2024-02-20 ENCOUNTER — Other Ambulatory Visit: Payer: Self-pay

## 2024-02-20 ENCOUNTER — Encounter (HOSPITAL_COMMUNITY): Payer: Self-pay | Admitting: Internal Medicine

## 2024-02-20 ENCOUNTER — Encounter (HOSPITAL_COMMUNITY)
Admission: RE | Disposition: A | Discharge status: Home / Self Care | Payer: Self-pay | Source: Home / Self Care | Attending: Internal Medicine

## 2024-02-20 DIAGNOSIS — Z7984 Long term (current) use of oral hypoglycemic drugs: Secondary | ICD-10-CM | POA: Diagnosis not present

## 2024-02-20 DIAGNOSIS — Z7985 Long-term (current) use of injectable non-insulin antidiabetic drugs: Secondary | ICD-10-CM | POA: Insufficient documentation

## 2024-02-20 DIAGNOSIS — K640 First degree hemorrhoids: Secondary | ICD-10-CM | POA: Diagnosis not present

## 2024-02-20 DIAGNOSIS — Z1211 Encounter for screening for malignant neoplasm of colon: Secondary | ICD-10-CM

## 2024-02-20 DIAGNOSIS — N189 Chronic kidney disease, unspecified: Secondary | ICD-10-CM | POA: Diagnosis not present

## 2024-02-20 DIAGNOSIS — E1122 Type 2 diabetes mellitus with diabetic chronic kidney disease: Secondary | ICD-10-CM | POA: Diagnosis not present

## 2024-02-20 DIAGNOSIS — E119 Type 2 diabetes mellitus without complications: Secondary | ICD-10-CM | POA: Diagnosis not present

## 2024-02-20 DIAGNOSIS — K6289 Other specified diseases of anus and rectum: Secondary | ICD-10-CM

## 2024-02-20 DIAGNOSIS — I10 Essential (primary) hypertension: Secondary | ICD-10-CM

## 2024-02-20 DIAGNOSIS — K649 Unspecified hemorrhoids: Secondary | ICD-10-CM | POA: Diagnosis not present

## 2024-02-20 DIAGNOSIS — I129 Hypertensive chronic kidney disease with stage 1 through stage 4 chronic kidney disease, or unspecified chronic kidney disease: Secondary | ICD-10-CM | POA: Insufficient documentation

## 2024-02-20 HISTORY — PX: COLONOSCOPY WITH PROPOFOL: SHX5780

## 2024-02-20 LAB — GLUCOSE, CAPILLARY: Glucose-Capillary: 129 mg/dL — ABNORMAL HIGH (ref 70–99)

## 2024-02-20 SURGERY — COLONOSCOPY WITH PROPOFOL
Anesthesia: General

## 2024-02-20 MED ORDER — PROPOFOL 10 MG/ML IV BOLUS
INTRAVENOUS | Status: DC | PRN
  Administered 2024-02-20: 70 mg via INTRAVENOUS
  Administered 2024-02-20: 30 mg via INTRAVENOUS

## 2024-02-20 MED ORDER — LACTATED RINGERS IV SOLN: INTRAVENOUS | Status: DC

## 2024-02-20 MED ORDER — LIDOCAINE HCL (CARDIAC) PF 100 MG/5ML IV SOSY
PREFILLED_SYRINGE | INTRAVENOUS | Status: DC | PRN
  Administered 2024-02-20: 60 mg via INTRATRACHEAL

## 2024-02-20 MED ORDER — PROPOFOL 500 MG/50ML IV EMUL
INTRAVENOUS | Status: DC | PRN
Start: 2024-02-20 — End: 2024-02-20
  Administered 2024-02-20: 125 ug/kg/min via INTRAVENOUS

## 2024-02-20 NOTE — Transfer of Care (Addendum)
 Immediate Anesthesia Transfer of Care Note  Patient: Cindy Mcknight  Procedure(s) Performed: COLONOSCOPY WITH PROPOFOL  Patient Location: Endoscopy Unit  Anesthesia Type:General  Level of Consciousness: drowsy and patient cooperative  Airway & Oxygen Therapy: Patient Spontanous Breathing and Patient connected to nasal cannula oxygen  Post-op Assessment: Report given to RN and Post -op Vital signs reviewed and stable  Post vital signs: Reviewed and stable  Last Vitals:  Vitals Value Taken Time  BP 94/50 02/20/24   1319  Temp 36.7 02/20/24   1319  Pulse 85 02/20/24   1319  Resp 18 02/20/24   1319  SpO2 96% 02/20/24   1319    Last Pain:  Vitals:   02/20/24 1302  TempSrc:   PainSc: 0-No pain      Patients Stated Pain Goal: 7 (02/20/24 1216)  Complications: No notable events documented.

## 2024-02-20 NOTE — Discharge Instructions (Signed)
  Colonoscopy Discharge Instructions  Read the instructions outlined below and refer to this sheet in the next few weeks. These discharge instructions provide you with general information on caring for yourself after you leave the hospital. Your doctor may also give you specific instructions. While your treatment has been planned according to the most current medical practices available, unavoidable complications occasionally occur. If you have any problems or questions after discharge, call Dr. Jena Gauss at [phone]. ACTIVITY You may resume your regular activity, but move at a slower pace for the next 24 hours.  Take frequent rest periods for the next 24 hours.  Walking will help get rid of the air and reduce the bloated feeling in your belly (abdomen).  No driving for 24 hours (because of the medicine (anesthesia) used during the test).   Do not sign any important legal documents or operate any machinery for 24 hours (because of the anesthesia used during the test).  NUTRITION Drink plenty of fluids.  You may resume your normal diet as instructed by your doctor.  Begin with a light meal and progress to your normal diet. Heavy or fried foods are harder to digest and may make you feel sick to your stomach (nauseated).  Avoid alcoholic beverages for 24 hours or as instructed.  MEDICATIONS You may resume your normal medications unless your doctor tells you otherwise.  WHAT YOU CAN EXPECT TODAY Some feelings of bloating in the abdomen.  Passage of more gas than usual.  Spotting of blood in your stool or on the toilet paper.  IF YOU HAD POLYPS REMOVED DURING THE COLONOSCOPY: No aspirin products for 7 days or as instructed.  No alcohol for 7 days or as instructed.  Eat a soft diet for the next 24 hours.  FINDING OUT THE RESULTS OF YOUR TEST Not all test results are available during your visit. If your test results are not back during the visit, make an appointment with your caregiver to find out the  results. Do not assume everything is normal if you have not heard from your caregiver or the medical facility. It is important for you to follow up on all of your test results.  SEEK IMMEDIATE MEDICAL ATTENTION IF: You have more than a spotting of blood in your stool.  Your belly is swollen (abdominal distention).  You are nauseated or vomiting.  You have a temperature over 101.  You have abdominal pain or discomfort that is severe or gets worse throughout the day.     No polyps found today.  You do have internal hemorrhoids.  A future colonoscopy is not recommended  unless you develop new bowel symptoms.    At patient request, I called Troy Sine at [phone] rolled to voicemail.  I left a message.

## 2024-02-20 NOTE — Op Note (Signed)
 [facility] Patient Name: Cindy Mcknight Procedure Date: 02/20/2024 12:45 PM MRN: [id number] Date of Birth: 1956/02/12 Attending MD: Gennette Pac , MD, [id number] CSN: [id number] Age: 68 Admit Type: Outpatient Procedure:                Colonoscopy Indications:              Screening for colorectal malignant neoplasm Providers:                Gennette Pac, MD, Sheran Fava, Lennice Sites Technician, Technician Referring MD:              Medicines:                Propofol per Anesthesia Complications:            No immediate complications. Estimated Blood Loss:     Estimated blood loss: none. Procedure:                Pre-Anesthesia Assessment:                           - Prior to the procedure, a History and Physical                            was performed, and patient medications and                            allergies were reviewed. The patient's tolerance of                            previous anesthesia was also reviewed. The risks                            and benefits of the procedure and the sedation                            options and risks were discussed with the patient.                            All questions were answered, and informed consent                            was obtained. Prior Anticoagulants: The patient has                            taken no anticoagulant or antiplatelet agents. ASA                            Grade Assessment: II - A patient with mild systemic                            disease. After reviewing the risks and benefits,  the patient was deemed in satisfactory condition to                            undergo the procedure.                           After obtaining informed consent, the colonoscope                            was passed under direct vision. Throughout the                            procedure, the patient's blood pressure, pulse, and                             oxygen saturations were monitored continuously. The                            [phone]) scope was introduced through the                            anus and advanced to the the cecum, identified by                            appendiceal orifice and ileocecal valve. The                            colonoscopy was performed without difficulty. The                            patient tolerated the procedure well. The quality                            of the bowel preparation was adequate. The                            ileocecal valve, appendiceal orifice, and rectum                            were photographed. Scope In: 1:03:28 PM Scope Out: 1:15:00 PM Scope Withdrawal Time: 0 hours 8 minutes 53 seconds  Total Procedure Duration: 0 hours 11 minutes 32 seconds  Findings:      The perianal and digital rectal examinations were normal.      The colon (entire examined portion) appeared normal.      Non-bleeding internal hemorrhoids with associated anal papilla were       found during retroflexion. The hemorrhoids were moderate, medium-sized       and Grade I (internal hemorrhoids that do not prolapse).      The exam was otherwise without abnormality on direct and retroflexion       views. Impression:               - The entire examined colon is normal.                           -  Non-bleeding internal hemorrhoids.                           - The examination was otherwise normal on direct                            and retroflexion views.                           - No specimens collected. Moderate Sedation:      Moderate (conscious) sedation was personally administered by an       anesthesia professional. The following parameters were monitored: oxygen       saturation, heart rate, blood pressure, respiratory rate, EKG, adequacy       of pulmonary ventilation, and response to care. Recommendation:           - Patient has a contact number available for                             emergencies. The signs and symptoms of potential                            delayed complications were discussed with the                            patient. Return to normal activities tomorrow.                            Written discharge instructions were provided to the                            patient.                           - Advance diet as tolerated.                           - Continue present medications.                           - No repeat colonoscopy due to age.                           - Return to GI clinic PRN. Procedure Code(s):        --- Professional ---                           [phone], Colonoscopy, flexible; diagnostic, including                            collection of specimen(s) by brushing or washing,                            when performed (separate procedure) Diagnosis Code(s):        --- Professional ---  Z12.11, Encounter for screening for malignant                            neoplasm of colon                           K64.0, First degree hemorrhoids CPT copyright 2022 American [employer] Association. All rights reserved. The codes documented in this report are preliminary and upon coder review may  be revised to meet current compliance requirements. Gerrit Friends. Preslei Blakley, MD Gennette Pac, MD 02/20/2024 1:27:56 PM This report has been signed electronically. Number of Addenda: 0

## 2024-02-20 NOTE — Anesthesia Preprocedure Evaluation (Signed)
 Anesthesia Evaluation  Patient identified by MRN, date of birth, ID band Patient awake    Reviewed: Allergy & Precautions, H&P , NPO status , Patient's Chart, lab work & pertinent test results, reviewed documented beta blocker date and time   Airway Mallampati: II  TM Distance: >3 FB Neck ROM: full    Dental no notable dental hx.    Pulmonary neg pulmonary ROS   Pulmonary exam normal breath sounds clear to auscultation       Cardiovascular Exercise Tolerance: Good hypertension, negative cardio ROS  Rhythm:regular Rate:Normal     Neuro/Psych negative neurological ROS  negative psych ROS   GI/Hepatic negative GI ROS, Neg liver ROS,,,  Endo/Other  negative endocrine ROSdiabetes    Renal/GU Renal diseasenegative Renal ROS  negative genitourinary   Musculoskeletal   Abdominal   Peds  Hematology negative hematology ROS (+)   Anesthesia Other Findings   Reproductive/Obstetrics negative OB ROS                             Anesthesia Physical Anesthesia Plan  ASA: 3  Anesthesia Plan: General   Post-op Pain Management:    Induction:   PONV Risk Score and Plan: Propofol infusion  Airway Management Planned:   Additional Equipment:   Intra-op Plan:   Post-operative Plan:   Informed Consent: I have reviewed the patients History and Physical, chart, labs and discussed the procedure including the risks, benefits and alternatives for the proposed anesthesia with the patient or authorized representative who has indicated his/her understanding and acceptance.     Dental Advisory Given  Plan Discussed with: CRNA  Anesthesia Plan Comments:        Anesthesia Quick Evaluation

## 2024-02-20 NOTE — H&P (Signed)
 @LOGO @   Primary Care Physician:  Estanislado Pandy, MD Primary Gastroenterologist:  Dr. Jena Gauss  Pre-Procedure History & Physical: HPI:  Cindy Mcknight is a 68 y.o. female is here for a screening colonoscopy.  If colonoscopy 12 years ago.  No bowel symptoms.  Past Medical History:  Diagnosis Date   Arthritis    Chronic renal insufficiency    Diabetes mellitus    Type 2   Fainting    on occasion when she see needles   Hypertension    Obesity    Syncope and collapse 12/22/2015    Past Surgical History:  Procedure Laterality Date   BACK SURGERY     february 2024 and October 2024   BREAST BIOPSY  2004   results negative   CATARACT EXTRACTION Bilateral    CESAREAN SECTION  12/16/1986   cesect     COLONOSCOPY  06/08/2012   Procedure: COLONOSCOPY;  Surgeon: Corbin Ade, MD;  Location: [facility];  Service: Endoscopy;  Laterality: N/A;  10:45 AM   EXPLORATORY LAPAROTOMY  12/17/1995   SEPTOPLASTY  12/16/2002   TUBAL LIGATION  1997    Prior to Admission medications   Medication Sig Start Date End Date Taking? Authorizing Provider  acetaminophen (TYLENOL) 500 MG tablet Take 500 mg by mouth every 6 (six) hours as needed.   Yes [provider]  Calcium Carb-Cholecalciferol (CALCIUM 600+D3 PO) Take 1 tablet by mouth daily.   Yes [provider]  Cholecalciferol (VITAMIN D3) 50 MCG (2000 UT) CAPS Take 2,000 Units by mouth daily.   Yes [provider]  lisinopril (PRINIVIL,ZESTRIL) 20 MG tablet Take 20 mg by mouth daily.   Yes [provider]  metFORMIN (GLUCOPHAGE-XR) 500 MG 24 hr tablet Take 500 mg by mouth 2 (two) times daily with a meal.   Yes [provider]  Sodium Sulfate-Mag Sulfate-KCl (SUTAB) [phone] MG TABS As directed 01/23/24  Yes Jennife Zaucha, Gerrit Friends, MD  OZEMPIC, 1 MG/DOSE, 4 MG/3ML SOPN Inject 1 mg into the skin once a week.    [provider]    Allergies as of 01/23/2024   (No Known Allergies)    Family  History  Problem Relation Age of Onset   Diabetes Mother    Syncope episode Mother    Cancer Father        prostate   Syncope episode Brother    Syncope episode Brother     Social History   Socioeconomic History   Marital status: Married    Spouse name: Not on file   Number of children: 1   Years of education: master's   Highest education level: Not on file  Occupational History   Occupation: retired  Tobacco Use   Smoking status: Never   Smokeless tobacco: Never  Vaping Use   Vaping status: Never Used  Substance and Sexual Activity   Alcohol use: No   Drug use: No   Sexual activity: Yes  Other Topics Concern   Not on file  Social History Narrative   Patient drinks about 7-8 cups of tea daily.   Patient is left handed.    Social Drivers of Corporate investment banker Strain: Not on file  Food Insecurity: Not on file  Transportation Needs: Not on file  Physical Activity: Not on file  Stress: Not on file  Social Connections: Not on file  Intimate Partner Violence: Not on file    Review of Systems: See HPI, otherwise negative ROS  Physical Exam: BP 133/66   Pulse 81   Temp 97.9 F (36.6 C) (Oral)   Resp 18   Ht 5\' 6"  (1.676 m)   Wt 96.2 kg   SpO2 97%   BMI 34.22 kg/m  General:   Alert,  Well-developed, well-nourished, pleasant and cooperative in NAD Lungs:  Clear throughout to auscultation.   No wheezes, crackles, or rhonchi. No acute distress. Heart:  Regular rate and rhythm; no murmurs, clicks, rubs,  or gallops. Abdomen:  Soft, nontender and nondistended. No masses, hepatosplenomegaly or hernias noted. Normal bowel sounds, without guarding, and without rebound.   Impression/Plan: Cindy Mcknight is now here to undergo a screening colonoscopy.  Average risk examination.  Risks, benefits, limitations, imponderables and alternatives regarding colonoscopy have been reviewed with the patient. Questions have been answered. All parties agreeable.      Notice:  This dictation was prepared with Dragon dictation along with smaller phrase technology. Any transcriptional errors that result from this process are unintentional and may not be corrected upon review.

## 2024-02-23 ENCOUNTER — Encounter (HOSPITAL_COMMUNITY): Payer: Self-pay | Admitting: Internal Medicine

## 2024-02-23 NOTE — Anesthesia Postprocedure Evaluation (Signed)
 Anesthesia Post Note  Patient: Cindy Mcknight  Procedure(s) Performed: COLONOSCOPY WITH PROPOFOL  Patient location during evaluation: Phase II Anesthesia Type: General Level of consciousness: awake Pain management: pain level controlled Vital Signs Assessment: post-procedure vital signs reviewed and stable Respiratory status: spontaneous breathing and respiratory function stable Cardiovascular status: blood pressure returned to baseline and stable Postop Assessment: no headache and no apparent nausea or vomiting Anesthetic complications: no Comments: Late entry   No notable events documented.   Last Vitals:  Vitals:   02/20/24 1216 02/20/24 1319  BP: 133/66 (!) 94/50  Pulse: 81 85  Resp: 18 18  Temp: 36.6 C 36.7 C  SpO2: 97% 96%    Last Pain:  Vitals:   02/20/24 1319  TempSrc: Oral  PainSc: 0-No pain                 Windell Norfolk

## 2024-02-24 DIAGNOSIS — Z48811 Encounter for surgical aftercare following surgery on the nervous system: Secondary | ICD-10-CM | POA: Diagnosis not present

## 2024-02-24 DIAGNOSIS — M545 Low back pain, unspecified: Secondary | ICD-10-CM | POA: Diagnosis not present

## 2024-02-24 DIAGNOSIS — M6281 Muscle weakness (generalized): Secondary | ICD-10-CM | POA: Diagnosis not present

## 2024-02-26 ENCOUNTER — Emergency Department (HOSPITAL_COMMUNITY)

## 2024-02-26 ENCOUNTER — Observation Stay (HOSPITAL_BASED_OUTPATIENT_CLINIC_OR_DEPARTMENT_OTHER): Admitting: Certified Registered"

## 2024-02-26 ENCOUNTER — Observation Stay (HOSPITAL_COMMUNITY): Admitting: Certified Registered"

## 2024-02-26 ENCOUNTER — Observation Stay (HOSPITAL_COMMUNITY)
Admission: EM | Admit: 2024-02-26 | Discharge: 2024-02-26 | Disposition: A | Discharge status: Home / Self Care | Attending: Surgery | Admitting: Surgery

## 2024-02-26 ENCOUNTER — Other Ambulatory Visit: Payer: Self-pay

## 2024-02-26 ENCOUNTER — Encounter (HOSPITAL_COMMUNITY)
Admission: EM | Disposition: A | Discharge status: Home / Self Care | Payer: Self-pay | Source: Home / Self Care | Attending: Emergency Medicine

## 2024-02-26 ENCOUNTER — Encounter (HOSPITAL_COMMUNITY): Payer: Self-pay

## 2024-02-26 DIAGNOSIS — I10 Essential (primary) hypertension: Secondary | ICD-10-CM | POA: Diagnosis not present

## 2024-02-26 DIAGNOSIS — K3580 Unspecified acute appendicitis: Secondary | ICD-10-CM | POA: Diagnosis not present

## 2024-02-26 DIAGNOSIS — K660 Peritoneal adhesions (postprocedural) (postinfection): Secondary | ICD-10-CM | POA: Diagnosis not present

## 2024-02-26 DIAGNOSIS — K37 Unspecified appendicitis: Principal | ICD-10-CM | POA: Diagnosis present

## 2024-02-26 DIAGNOSIS — K353 Acute appendicitis with localized peritonitis, without perforation or gangrene: Secondary | ICD-10-CM

## 2024-02-26 DIAGNOSIS — R1031 Right lower quadrant pain: Secondary | ICD-10-CM | POA: Diagnosis present

## 2024-02-26 DIAGNOSIS — K8689 Other specified diseases of pancreas: Secondary | ICD-10-CM | POA: Diagnosis not present

## 2024-02-26 DIAGNOSIS — K565 Intestinal adhesions [bands], unspecified as to partial versus complete obstruction: Secondary | ICD-10-CM | POA: Insufficient documentation

## 2024-02-26 DIAGNOSIS — Z79899 Other long term (current) drug therapy: Secondary | ICD-10-CM | POA: Insufficient documentation

## 2024-02-26 DIAGNOSIS — R1030 Lower abdominal pain, unspecified: Secondary | ICD-10-CM | POA: Diagnosis not present

## 2024-02-26 DIAGNOSIS — Z7984 Long term (current) use of oral hypoglycemic drugs: Secondary | ICD-10-CM | POA: Insufficient documentation

## 2024-02-26 DIAGNOSIS — E119 Type 2 diabetes mellitus without complications: Secondary | ICD-10-CM | POA: Insufficient documentation

## 2024-02-26 DIAGNOSIS — K8020 Calculus of gallbladder without cholecystitis without obstruction: Secondary | ICD-10-CM | POA: Diagnosis not present

## 2024-02-26 HISTORY — PX: XI ROBOTIC LAPAROSCOPIC ASSISTED APPENDECTOMY: SHX6877

## 2024-02-26 LAB — CBC WITH DIFFERENTIAL/PLATELET
Abs Immature Granulocytes: 0.04 10*3/uL (ref 0.00–0.07)
Basophils Absolute: 0 10*3/uL (ref 0.0–0.1)
Basophils Relative: 0 %
Eosinophils Absolute: 0 10*3/uL (ref 0.0–0.5)
Eosinophils Relative: 0 %
HCT: 35 % — ABNORMAL LOW (ref 36.0–46.0)
Hemoglobin: 11.4 g/dL — ABNORMAL LOW (ref 12.0–15.0)
Immature Granulocytes: 0 %
Lymphocytes Relative: 11 %
Lymphs Abs: 1.2 10*3/uL (ref 0.7–4.0)
MCH: 31.5 pg (ref 26.0–34.0)
MCHC: 32.6 g/dL (ref 30.0–36.0)
MCV: 96.7 fL (ref 80.0–100.0)
Monocytes Absolute: 0.5 10*3/uL (ref 0.1–1.0)
Monocytes Relative: 4 %
Neutro Abs: 9.7 10*3/uL — ABNORMAL HIGH (ref 1.7–7.7)
Neutrophils Relative %: 85 %
Platelets: 233 10*3/uL (ref 150–400)
RBC: 3.62 MIL/uL — ABNORMAL LOW (ref 3.87–5.11)
RDW: 13.6 % (ref 11.5–15.5)
WBC: 11.6 10*3/uL — ABNORMAL HIGH (ref 4.0–10.5)
nRBC: 0 % (ref 0.0–0.2)

## 2024-02-26 LAB — COMPREHENSIVE METABOLIC PANEL
ALT: 16 U/L (ref 0–44)
AST: 17 U/L (ref 15–41)
Albumin: 3.5 g/dL (ref 3.5–5.0)
Alkaline Phosphatase: 83 U/L (ref 38–126)
Anion gap: 8 (ref 5–15)
BUN: 16 mg/dL (ref 8–23)
CO2: 24 mmol/L (ref 22–32)
Calcium: 9.6 mg/dL (ref 8.9–10.3)
Chloride: 104 mmol/L (ref 98–111)
Creatinine, Ser: 0.92 mg/dL (ref 0.44–1.00)
GFR, Estimated: >60 mL/min (ref >60)
Glucose, Bld: 161 mg/dL — ABNORMAL HIGH (ref 70–99)
Potassium: 4.4 mmol/L (ref 3.5–5.1)
Sodium: 136 mmol/L (ref 135–145)
Total Bilirubin: 0.7 mg/dL (ref 0.0–1.2)
Total Protein: 6.9 g/dL (ref 6.5–8.1)

## 2024-02-26 LAB — GLUCOSE, CAPILLARY
Glucose-Capillary: 126 mg/dL — ABNORMAL HIGH (ref 70–99)
Glucose-Capillary: 125 mg/dL — ABNORMAL HIGH (ref 70–99)

## 2024-02-26 LAB — LIPASE, BLOOD: Lipase: 36 U/L (ref 11–51)

## 2024-02-26 LAB — HIV ANTIBODY (ROUTINE TESTING W REFLEX): HIV Screen 4th Generation wRfx: NONREACTIVE

## 2024-02-26 SURGERY — APPENDECTOMY, ROBOT-ASSISTED, LAPAROSCOPIC
Anesthesia: General | Site: Abdomen

## 2024-02-26 MED ORDER — ONDANSETRON HCL 4 MG/2ML IJ SOLN
4.0000 mg | Freq: Four times a day (QID) | INTRAMUSCULAR | Status: DC | PRN
  Administered 2024-02-26: 4 mg via INTRAVENOUS

## 2024-02-26 MED ORDER — SCOPOLAMINE 1 MG/3DAYS TD PT72SCOPOLAMINE 1 MG/3DAYS
MEDICATED_PATCH | TRANSDERMAL | Status: DC | PRN
Start: 2024-02-26 — End: 2024-02-26
  Administered 2024-02-26: 1 via TRANSDERMAL

## 2024-02-26 MED ORDER — ACETAMINOPHEN 500 MG PO TABS
1000.0000 mg | ORAL_TABLET | Freq: Four times a day (QID) | ORAL | Status: DC
  Administered 2024-02-26: 1000 mg via ORAL
  Filled 2024-02-26: qty 2

## 2024-02-26 MED ORDER — ONDANSETRON HCL 4 MG PO TABS: 4.0000 mg | ORAL_TABLET | Freq: Every day | ORAL | 1 refills | Status: DC | PRN

## 2024-02-26 MED ORDER — HYDROMORPHONE HCL 1 MG/ML IJ SOLN: 1.0000 mg | Freq: Once | INTRAMUSCULAR | Status: DC

## 2024-02-26 MED ORDER — CHLORHEXIDINE GLUCONATE CLOTH 2 % EX PADS: 6.0000 | MEDICATED_PAD | Freq: Once | CUTANEOUS | Status: DC

## 2024-02-26 MED ORDER — ROCURONIUM BROMIDE 10 MG/ML (PF) SYRINGE
PREFILLED_SYRINGE | INTRAVENOUS | Status: DC | PRN
  Administered 2024-02-26: 60 mg via INTRAVENOUS

## 2024-02-26 MED ORDER — EPHEDRINE SULFATE-NACL 50-0.9 MG/10ML-% IV SOSY
PREFILLED_SYRINGE | INTRAVENOUS | Status: DC | PRN
  Administered 2024-02-26: 10 mg via INTRAVENOUS
  Administered 2024-02-26: 5 mg via INTRAVENOUS

## 2024-02-26 MED ORDER — PHENYLEPHRINE 80 MCG/ML (10ML) SYRINGE FOR IV PUSH (FOR BLOOD PRESSURE SUPPORT)
PREFILLED_SYRINGE | INTRAVENOUS | Status: AC
  Filled 2024-02-26: qty 10

## 2024-02-26 MED ORDER — LIDOCAINE HCL (PF) 2 % IJ SOLN
INTRAMUSCULAR | Status: DC | PRN
  Administered 2024-02-26: 100 mg via INTRADERMAL

## 2024-02-26 MED ORDER — ONDANSETRON HCL 4 MG/2ML IJ SOLN: 4.0000 mg | Freq: Once | INTRAMUSCULAR | Status: DC | PRN

## 2024-02-26 MED ORDER — SCOPOLAMINE 1 MG/3DAYS TD PT72
MEDICATED_PATCH | TRANSDERMAL | Status: AC
  Filled 2024-02-26: qty 1

## 2024-02-26 MED ORDER — PROPOFOL 10 MG/ML IV BOLUS
INTRAVENOUS | Status: DC | PRN
  Administered 2024-02-26: 150 mg via INTRAVENOUS
  Administered 2024-02-26: 50 mg via INTRAVENOUS

## 2024-02-26 MED ORDER — PIPERACILLIN-TAZOBACTAM 3.375 G IVPB 30 MIN
3.3750 g | Freq: Once | INTRAVENOUS | Status: AC
  Administered 2024-02-26: 3.375 g via INTRAVENOUS
  Filled 2024-02-26: qty 50

## 2024-02-26 MED ORDER — FENTANYL CITRATE (PF) 100 MCG/2ML IJ SOLN
INTRAMUSCULAR | Status: DC | PRN
  Administered 2024-02-26 (×2): 50 ug via INTRAVENOUS

## 2024-02-26 MED ORDER — IOHEXOL 300 MG/ML  SOLN
100.0000 mL | Freq: Once | INTRAMUSCULAR | Status: AC | PRN
  Administered 2024-02-26: 100 mL via INTRAVENOUS

## 2024-02-26 MED ORDER — OXYCODONE HCL 5 MG/5ML PO SOLN: 5.0000 mg | Freq: Once | ORAL | Status: AC | PRN

## 2024-02-26 MED ORDER — BUPIVACAINE HCL (PF) 0.5 % IJ SOLN
INTRAMUSCULAR | Status: AC
  Filled 2024-02-26: qty 30

## 2024-02-26 MED ORDER — ONDANSETRON HCL 4 MG/2ML IJ SOLN
4.0000 mg | Freq: Once | INTRAMUSCULAR | Status: AC
  Administered 2024-02-26: 4 mg via INTRAVENOUS
  Filled 2024-02-26: qty 2

## 2024-02-26 MED ORDER — LIDOCAINE HCL (PF) 2 % IJ SOLN
INTRAMUSCULAR | Status: AC
  Filled 2024-02-26: qty 5

## 2024-02-26 MED ORDER — ENOXAPARIN SODIUM 40 MG/0.4ML IJ SOSY: 40.0000 mg | PREFILLED_SYRINGE | INTRAMUSCULAR | Status: DC

## 2024-02-26 MED ORDER — SUGAMMADEX SODIUM 200 MG/2ML IV SOLN
INTRAVENOUS | Status: AC
Start: 2024-02-26 — End: ?
  Filled 2024-02-26: qty 2

## 2024-02-26 MED ORDER — FENTANYL CITRATE PF 50 MCG/ML IJ SOSY
25.0000 ug | PREFILLED_SYRINGE | INTRAMUSCULAR | Status: DC | PRN
  Administered 2024-02-26 (×2): 50 ug via INTRAVENOUS
  Filled 2024-02-26 (×2): qty 1

## 2024-02-26 MED ORDER — PROPOFOL 10 MG/ML IV BOLUS
INTRAVENOUS | Status: AC
  Filled 2024-02-26: qty 20

## 2024-02-26 MED ORDER — OXYCODONE HCL 5 MG PO TABS
5.0000 mg | ORAL_TABLET | Freq: Once | ORAL | Status: AC | PRN
  Administered 2024-02-26: 5 mg via ORAL
  Filled 2024-02-26: qty 1

## 2024-02-26 MED ORDER — ACETAMINOPHEN 500 MG PO TABS: 1000.0000 mg | ORAL_TABLET | Freq: Four times a day (QID) | ORAL | 0 refills | Status: AC

## 2024-02-26 MED ORDER — EPHEDRINE 5 MG/ML INJ
INTRAVENOUS | Status: AC
  Filled 2024-02-26: qty 5

## 2024-02-26 MED ORDER — CHLORHEXIDINE GLUCONATE 0.12 % MT SOLN: 15.0000 mL | Freq: Once | OROMUCOSAL | Status: DC

## 2024-02-26 MED ORDER — MIDAZOLAM HCL 2 MG/2ML IJ SOLN
INTRAMUSCULAR | Status: DC | PRN
  Administered 2024-02-26: 2 mg via INTRAVENOUS

## 2024-02-26 MED ORDER — LACTATED RINGERS IV SOLN: INTRAVENOUS | Status: DC

## 2024-02-26 MED ORDER — DEXAMETHASONE SODIUM PHOSPHATE 10 MG/ML IJ SOLN
INTRAMUSCULAR | Status: DC | PRN
Start: 2024-02-26 — End: 2024-02-26
  Administered 2024-02-26: 5 mg via INTRAVENOUS

## 2024-02-26 MED ORDER — OXYCODONE HCL 5 MG PO TABS
5.0000 mg | ORAL_TABLET | ORAL | Status: DC | PRN
  Administered 2024-02-26: 10 mg via ORAL
  Filled 2024-02-26: qty 2

## 2024-02-26 MED ORDER — FENTANYL CITRATE (PF) 100 MCG/2ML IJ SOLN
INTRAMUSCULAR | Status: AC
  Filled 2024-02-26: qty 2

## 2024-02-26 MED ORDER — PHENYLEPHRINE 80 MCG/ML (10ML) SYRINGE FOR IV PUSH (FOR BLOOD PRESSURE SUPPORT)
PREFILLED_SYRINGE | INTRAVENOUS | Status: DC | PRN
  Administered 2024-02-26 (×4): 160 ug via INTRAVENOUS

## 2024-02-26 MED ORDER — BUPIVACAINE HCL (PF) 0.5 % IJ SOLN
INTRAMUSCULAR | Status: DC | PRN
  Administered 2024-02-26: 30 mL

## 2024-02-26 MED ORDER — ORAL CARE MOUTH RINSE: 15.0000 mL | Freq: Once | OROMUCOSAL | Status: DC

## 2024-02-26 MED ORDER — ONDANSETRON 4 MG PO TBDP: 4.0000 mg | ORAL_TABLET | Freq: Four times a day (QID) | ORAL | Status: DC | PRN

## 2024-02-26 MED ORDER — SUGAMMADEX SODIUM 200 MG/2ML IV SOLN
INTRAVENOUS | Status: DC | PRN
  Administered 2024-02-26: 200 mg via INTRAVENOUS

## 2024-02-26 MED ORDER — ONDANSETRON HCL 4 MG/2ML IJ SOLN
INTRAMUSCULAR | Status: AC
  Filled 2024-02-26: qty 2

## 2024-02-26 MED ORDER — STERILE WATER FOR IRRIGATION IR SOLN
Status: DC | PRN
  Administered 2024-02-26: 1000 mL

## 2024-02-26 MED ORDER — KETOROLAC TROMETHAMINE 30 MG/ML IJ SOLN
INTRAMUSCULAR | Status: AC
Start: 2024-02-26 — End: ?
  Filled 2024-02-26: qty 1

## 2024-02-26 MED ORDER — MIDAZOLAM HCL 2 MG/2ML IJ SOLN
INTRAMUSCULAR | Status: AC
  Filled 2024-02-26: qty 2

## 2024-02-26 MED ORDER — KETOROLAC TROMETHAMINE 30 MG/ML IJ SOLN
INTRAMUSCULAR | Status: DC | PRN
  Administered 2024-02-26: 30 mg via INTRAVENOUS

## 2024-02-26 MED ORDER — DOCUSATE SODIUM 100 MG PO CAPS: 100.0000 mg | ORAL_CAPSULE | Freq: Two times a day (BID) | ORAL | 2 refills | Status: DC

## 2024-02-26 MED ORDER — MORPHINE SULFATE (PF) 4 MG/ML IV SOLN
4.0000 mg | Freq: Once | INTRAVENOUS | Status: AC
  Administered 2024-02-26: 4 mg via INTRAVENOUS
  Filled 2024-02-26: qty 1

## 2024-02-26 MED ORDER — HYDROMORPHONE HCL 1 MG/ML IJ SOLN
0.5000 mg | Freq: Once | INTRAMUSCULAR | Status: AC
  Administered 2024-02-26: 0.5 mg via INTRAVENOUS
  Filled 2024-02-26: qty 0.5

## 2024-02-26 MED ORDER — ROCURONIUM BROMIDE 10 MG/ML (PF) SYRINGE
PREFILLED_SYRINGE | INTRAVENOUS | Status: AC
Start: 2024-02-26 — End: ?
  Filled 2024-02-26: qty 10

## 2024-02-26 MED ORDER — PHENYLEPHRINE HCL-NACL 20-0.9 MG/250ML-% IV SOLN
INTRAVENOUS | Status: DC | PRN
  Administered 2024-02-26: 50 ug/min via INTRAVENOUS

## 2024-02-26 MED ORDER — OXYCODONE HCL 5 MG PO TABS: 5.0000 mg | ORAL_TABLET | Freq: Four times a day (QID) | ORAL | 0 refills | Status: DC | PRN

## 2024-02-26 SURGICAL SUPPLY — 45 items
CHLORAPREP W/TINT 26 (MISCELLANEOUS) ×1 IMPLANT
COVER LIGHT HANDLE STERIS (MISCELLANEOUS) ×2 IMPLANT
COVER MAYO STAND XLG (MISCELLANEOUS) ×1 IMPLANT
COVER TIP SHEARS 8 DVNC (MISCELLANEOUS) IMPLANT
DEFOGGER SCOPE WARMER CLEARIFY (MISCELLANEOUS) ×1 IMPLANT
DERMABOND ADVANCED .7 DNX12 (GAUZE/BANDAGES/DRESSINGS) ×1 IMPLANT
DRAPE ARM DVNC X/XI (DISPOSABLE) ×3 IMPLANT
DRAPE COLUMN DVNC XI (DISPOSABLE) ×1 IMPLANT
FORCEPS BPLR R/ABLATION 8 DVNC (INSTRUMENTS) ×1 IMPLANT
GLOVE BIO SURGEON STRL SZ7.5 (GLOVE) IMPLANT
GLOVE BIOGEL PI IND STRL 6.5 (GLOVE) ×2 IMPLANT
GLOVE BIOGEL PI IND STRL 7.0 (GLOVE) ×3 IMPLANT
GLOVE BIOGEL PI IND STRL 7.5 (GLOVE) IMPLANT
GLOVE SURG SS PI 6.5 STRL IVOR (GLOVE) ×2 IMPLANT
GOWN STRL REUS W/TWL LRG LVL3 (GOWN DISPOSABLE) ×3 IMPLANT
GRASPER SUT TROCAR 14GX15 (MISCELLANEOUS) ×1 IMPLANT
KIT PINK PAD W/HEAD ARE REST (MISCELLANEOUS) ×1 IMPLANT
KIT PINK PAD W/HEAD ARM REST (MISCELLANEOUS) ×1 IMPLANT
KIT TURNOVER KIT A (KITS) ×1 IMPLANT
MANIFOLD NEPTUNE II (INSTRUMENTS) ×1 IMPLANT
NDL HYPO 21X1.5 SAFETY (NEEDLE) ×1 IMPLANT
NDL INSUFFLATION 14GA 120MM (NEEDLE) ×1 IMPLANT
NEEDLE HYPO 21X1.5 SAFETY (NEEDLE) ×1 IMPLANT
NEEDLE INSUFFLATION 14GA 120MM (NEEDLE) ×1 IMPLANT
OBTURATOR OPTICAL STND 8 DVNC (TROCAR) ×1 IMPLANT
OBTURATOR OPTICALSTD 8 DVNC (TROCAR) ×1 IMPLANT
PACK LAP CHOLE LZT030E (CUSTOM PROCEDURE TRAY) ×1 IMPLANT
PENCIL HANDSWITCHING (ELECTRODE) ×1 IMPLANT
RELOAD STAPLE 45 2.5 WHT DVNC (STAPLE) IMPLANT
RELOAD STAPLER 2.5X45 WHT DVNC (STAPLE) ×1 IMPLANT
SCISSORS MNPLR CVD DVNC XI (INSTRUMENTS) IMPLANT
SEAL UNIV 5-12 XI (MISCELLANEOUS) ×4 IMPLANT
SEALER VESSEL EXT DVNC XI (MISCELLANEOUS) ×1 IMPLANT
SET BASIN LINEN APH (SET/KITS/TRAYS/PACK) ×1 IMPLANT
SET TUBE SMOKE EVAC HIGH FLOW (TUBING) ×1 IMPLANT
STAPLER 45 SUREFORM DVNC (STAPLE) ×1 IMPLANT
STAPLER RELOAD 2.5X45 WHT DVNC (STAPLE) ×1 IMPLANT
SUT MNCRL AB 4-0 PS2 18 (SUTURE) ×1 IMPLANT
SUT VICRYL 0 AB UR-6 (SUTURE) ×1 IMPLANT
SYR 30ML LL (SYRINGE) ×1 IMPLANT
SYS RETRIEVAL 5MM INZII UNIV (BASKET) ×1 IMPLANT
SYSTEM RETRIEVL 5MM INZII UNIV (BASKET) ×1 IMPLANT
TAPE TRANSPORE STRL 2 31045 (GAUZE/BANDAGES/DRESSINGS) ×1 IMPLANT
TRAY FOLEY W/BAG SLVR 16FR ST (SET/KITS/TRAYS/PACK) ×1 IMPLANT
WATER STERILE IRR 500ML POUR (IV SOLUTION) ×1 IMPLANT

## 2024-02-26 NOTE — Discharge Summary (Signed)
 Physician Discharge Summary  Patient ID: Cindy Mcknight MRN: [id number] DOB/AGE: 69/04/1956 69 y.o.  Admit date: 02/26/2024 Discharge date: 02/26/2024  Admission Diagnoses: Acute appendicitis  Discharge Diagnoses:  Principal Problem:   Appendicitis Active Problems:   Acute appendicitis   Intra-abdominal adhesions   Discharged Condition: stable  Hospital Course: Patient is a 68 year old female who presented to the hospital with a 12-hour history of lower abdominal pain.  She was noted to have acute appendicitis on CT imaging.  She is status post robotic assisted laparoscopic appendectomy and lysis of adhesions today.  She was placed in observation.  Patient was able to tolerate a diet, and her pain was well-controlled with oral medications.  She is stable for discharge home at this time.  I have discharged her home with prescriptions for Roxicodone, Tylenol, Zofran, and Colace.  I will have a phone follow-up with the patient in 2 to 3 weeks postoperatively.  Consults: None  Significant Diagnostic Studies: radiology: CT scan: Acute appendicitis  Treatments: antibiotics: Zosyn and surgery: Robotic assisted laparoscopic appendectomy, lysis of adhesions  Discharge Exam: Blood pressure 118/78, pulse 76, temperature 97.6 F (36.4 C), temperature source Oral, resp. rate 18, height 5\' 6"  (1.676 m), weight 95.3 kg, SpO2 98%. General appearance: alert, cooperative, and no distress GI: Abdomen soft, nondistended, no percussion tenderness, mild incisional tenderness to palpation; no rigidity, guarding, rebound tenderness; incision sites C/D/I with skin glue in place  Disposition: Discharge disposition: 01-Home or Self Care       Discharge Instructions     Call MD for:  persistant nausea and vomiting   Complete by: As directed    Call MD for:  redness, tenderness, or signs of infection (pain, swelling, redness, odor or green/yellow discharge around incision site)   Complete by: As  directed    Call MD for:  severe uncontrolled pain   Complete by: As directed    Call MD for:  temperature >100.4   Complete by: As directed    Diet - low sodium heart healthy   Complete by: As directed    Increase activity slowly   Complete by: As directed       Allergies as of 02/26/2024   No Known Allergies      Medication List     TAKE these medications    acetaminophen 500 MG tablet Commonly known as: TYLENOL Take 2 tablets (1,000 mg total) by mouth every 6 (six) hours for 7 days. What changed:  how much to take when to take this reasons to take this   CALCIUM 600+D3 PO Take 1 tablet by mouth daily.   docusate sodium 100 MG capsule Commonly known as: Colace Take 1 capsule (100 mg total) by mouth 2 (two) times daily.   lisinopril 10 MG tablet Commonly known as: ZESTRIL Take 10 mg by mouth daily.   loratadine 10 MG tablet Commonly known as: CLARITIN Take 10 mg by mouth daily.   metFORMIN 500 MG 24 hr tablet Commonly known as: GLUCOPHAGE-XR Take 500 mg by mouth 2 (two) times daily with a meal.   ondansetron 4 MG tablet Commonly known as: Zofran Take 1 tablet (4 mg total) by mouth daily as needed for nausea or vomiting.   oxyCODONE 5 MG immediate release tablet Commonly known as: Roxicodone Take 1 tablet (5 mg total) by mouth every 6 (six) hours as needed.   Ozempic (1 MG/DOSE) 4 MG/3ML Sopn Generic drug: Semaglutide (1 MG/DOSE) Inject 1 mg into the skin once a week.  Vitamin D3 50 MCG (2000 UT) capsule Take 2,000 Units by mouth daily.        Follow-up Information     Erienne Spelman, Gustavus Messing, DO. Call.   Specialty: General Surgery Why: I will call you in 2-3 weeks for a phone follow up Contact information: [address] [place] [facility] [place] [address] [phone]                 Signed: Santina Evans A Berta Denson 02/26/2024, 4:11 PM

## 2024-02-26 NOTE — Progress Notes (Signed)
 Patient ate 2 graham crackers and finished off her shasta coke.  After patient ate the crackers, gave her 5mg  oxycodone PO.

## 2024-02-26 NOTE — Anesthesia Preprocedure Evaluation (Addendum)
 Anesthesia Evaluation  Patient identified by MRN, date of birth, ID band Patient awake    Reviewed: Allergy & Precautions, H&P , NPO status , Patient's Chart, lab work & pertinent test results, reviewed documented beta blocker date and time   Airway Mallampati: II  TM Distance: >3 FB Neck ROM: full  Mouth opening: Limited Mouth Opening  Dental  (+) Caps, Dental Advisory Given   Pulmonary neg pulmonary ROS   Pulmonary exam normal breath sounds clear to auscultation       Cardiovascular Exercise Tolerance: Good hypertension,  Rhythm:regular Rate:Normal     Neuro/Psych negative neurological ROS  negative psych ROS   GI/Hepatic negative GI ROS, Neg liver ROS,,,  Endo/Other  diabetes, Type 2    Renal/GU Renal disease  negative genitourinary   Musculoskeletal   Abdominal   Peds  Hematology negative hematology ROS (+)   Anesthesia Other Findings   Reproductive/Obstetrics negative OB ROS                             Anesthesia Physical Anesthesia Plan  ASA: 2 and emergent  Anesthesia Plan: General and General ETT   Post-op Pain Management:    Induction:   PONV Risk Score and Plan: Ondansetron  Airway Management Planned:   Additional Equipment:   Intra-op Plan:   Post-operative Plan:   Informed Consent: I have reviewed the patients History and Physical, chart, labs and discussed the procedure including the risks, benefits and alternatives for the proposed anesthesia with the patient or authorized representative who has indicated his/her understanding and acceptance.     Dental Advisory Given  Plan Discussed with: CRNA  Anesthesia Plan Comments:        Anesthesia Quick Evaluation

## 2024-02-26 NOTE — H&P (Signed)
 [facility] History and Physical  Reason for Referral: Acute appendicitis Referring Physician: Dr. Jarold Motto  Chief Complaint   Abdominal Pain     Cindy Mcknight is a 68 y.o. female.  HPI: Patient presents to the hospital for a 12-hour history of abdominal pain.  She states that starting last night, she had worsening right-sided and lower abdominal tenderness to palpation.  She did have some nausea with dry heaving, but denies any emesis.  She denies fevers and chills.  She had multiple bowel movements yesterday, and had a bowel movement this morning.  Nothing seems to make her pain better or worse.  She denies ever having pain like this in the past.  Her past medical history significant for diabetes and hypertension.  She is currently taking Ozempic with her last dose being on Monday.  Her abdominal surgical history is significant for a C-section and a tubal ligation.  In the ED, she was initially noted to be tachycardic, but this resolved with fluid resuscitation.  She has been hemodynamically stable.  She had a mild leukocytosis of 11.6.  Otherwise blood work was unremarkable.  She underwent a CT of the abdomen and pelvis which demonstrated acute appendicitis with the appendix in a retrocecal location.  She states that her pain is slightly improved with the pain medications that she has received today.  Past Medical History:  Diagnosis Date   Arthritis    Chronic renal insufficiency    Diabetes mellitus    Type 2   Fainting    on occasion when she see needles   Hypertension    Obesity    Syncope and collapse 12/22/2015    Past Surgical History:  Procedure Laterality Date   BACK SURGERY     february 2024 and October 2024   BREAST BIOPSY  2004   results negative   CATARACT EXTRACTION Bilateral    CESAREAN SECTION  12/16/1986   cesect     COLONOSCOPY  06/08/2012   Procedure: COLONOSCOPY;  Surgeon: Corbin Ade, MD;  Location: [facility];  Service:  Endoscopy;  Laterality: N/A;  10:45 AM   COLONOSCOPY WITH PROPOFOL N/A 02/20/2024   Procedure: COLONOSCOPY WITH PROPOFOL;  Surgeon: Corbin Ade, MD;  Location: [facility];  Service: Endoscopy;  Laterality: N/A;  130pm, asa 2   EXPLORATORY LAPAROTOMY  12/17/1995   SEPTOPLASTY  12/16/2002   TUBAL LIGATION  1997    Family History  Problem Relation Age of Onset   Diabetes Mother    Syncope episode Mother    Cancer Father        prostate   Syncope episode Brother    Syncope episode Brother     Social History   Tobacco Use   Smoking status: Never   Smokeless tobacco: Never  Vaping Use   Vaping status: Never Used  Substance Use Topics   Alcohol use: No   Drug use: No    Medications: I have reviewed the patient's current medications. Allergies as of 02/26/2024   No Known Allergies    ROS:  Pertinent items are noted in HPI.  Blood pressure 113/68, pulse 75, temperature 98.3 F (36.8 C), temperature source Oral, resp. rate 12, height 5\' 6"  (1.676 m), weight 95.3 kg, SpO2 99%. Physical Exam Vitals reviewed.  Constitutional:      Appearance: She is well-developed.  HENT:     Head: Normocephalic and atraumatic.  Eyes:     Extraocular Movements: Extraocular movements intact.  Pupils: Pupils are equal, round, and reactive to light.  Cardiovascular:     Rate and Rhythm: Normal rate.  Pulmonary:     Effort: Pulmonary effort is normal.  Abdominal:     Comments: Abdomen soft, nondistended, no percussion tenderness, mild right-sided abdominal and right lower quadrant tenderness to palpation; no rigidity, guarding, rebound tenderness  Skin:    General: Skin is warm and dry.  Neurological:     General: No focal deficit present.     Mental Status: She is alert and oriented to person, place, and time.  Psychiatric:        Mood and Affect: Mood normal.        Behavior: Behavior normal.     Results: Results for orders placed or performed during the hospital encounter  of 02/26/24 (from the past 48 hours)  CBC with Differential     Status: Abnormal   Collection Time: 02/26/24  6:14 AM  Result Value Ref Range   WBC 11.6 (H) 4.0 - 10.5 K/uL   RBC 3.62 (L) 3.87 - 5.11 MIL/uL   Hemoglobin 11.4 (L) 12.0 - 15.0 g/dL   HCT 16.1 (L) 09.6 - 04.5 %   MCV 96.7 80.0 - 100.0 fL   MCH 31.5 26.0 - 34.0 pg   MCHC 32.6 30.0 - 36.0 g/dL   RDW 40.9 81.1 - 91.4 %   Platelets 233 150 - 400 K/uL   nRBC 0.0 0.0 - 0.2 %   Neutrophils Relative % 85 %   Neutro Abs 9.7 (H) 1.7 - 7.7 K/uL   Lymphocytes Relative 11 %   Lymphs Abs 1.2 0.7 - 4.0 K/uL   Monocytes Relative 4 %   Monocytes Absolute 0.5 0.1 - 1.0 K/uL   Eosinophils Relative 0 %   Eosinophils Absolute 0.0 0.0 - 0.5 K/uL   Basophils Relative 0 %   Basophils Absolute 0.0 0.0 - 0.1 K/uL   Immature Granulocytes 0 %   Abs Immature Granulocytes 0.04 0.00 - 0.07 K/uL    Comment: Performed at [facility], [address], [place], [place] [address]  Comprehensive metabolic panel     Status: Abnormal   Collection Time: 02/26/24  6:14 AM  Result Value Ref Range   Sodium 136 135 - 145 mmol/L   Potassium 4.4 3.5 - 5.1 mmol/L   Chloride 104 98 - 111 mmol/L   CO2 24 22 - 32 mmol/L   Glucose, Bld 161 (H) 70 - 99 mg/dL    Comment: Glucose reference range applies only to samples taken after fasting for at least 8 hours.   BUN 16 8 - 23 mg/dL   Creatinine, Ser 6.21 0.44 - 1.00 mg/dL   Calcium 9.6 8.9 - 30.8 mg/dL   Total Protein 6.9 6.5 - 8.1 g/dL   Albumin 3.5 3.5 - 5.0 g/dL   AST 17 15 - 41 U/L   ALT 16 0 - 44 U/L   Alkaline Phosphatase 83 38 - 126 U/L   Total Bilirubin 0.7 0.0 - 1.2 mg/dL   GFR, Estimated >65 >78 mL/min    Comment: (NOTE) Calculated using the CKD-EPI Creatinine Equation (2021)    Anion gap 8 5 - 15    Comment: Performed at [facility], [address], [place], [place] [address]  Lipase, blood     Status: None   Collection Time: 02/26/24  6:14 AM  Result Value Ref Range   Lipase 36 11 - 51 U/L     Comment: Performed at [employer]  [facility], [address] [place], [place] [address]  Glucose, capillary     Status: Abnormal   Collection Time: 02/26/24  8:38 AM  Result Value Ref Range   Glucose-Capillary 126 (H) 70 - 99 mg/dL    Comment: Glucose reference range applies only to samples taken after fasting for at least 8 hours.    CT ABDOMEN PELVIS W CONTRAST Result Date: 02/26/2024 CLINICAL DATA:  Nonlocalized lower abdominal pain EXAM: CT ABDOMEN AND PELVIS WITH CONTRAST TECHNIQUE: Multidetector CT imaging of the abdomen and pelvis was performed using the standard protocol following bolus administration of intravenous contrast. RADIATION DOSE REDUCTION: This exam was performed according to the departmental dose-optimization program which includes automated exposure control, adjustment of the mA and/or kV according to patient size and/or use of iterative reconstruction technique. CONTRAST:  OMNIPAQUE IOHEXOL 300 MG/ML  SOLN COMPARISON:  04/14/2023 abdominal ultrasound FINDINGS: Lower chest:  No contributory findings. Hepatobiliary: No focal liver abnormality.Two large gallstones. No evidence of biliary inflammation or ductal dilatation. Pancreas: Pronounced in generalized fatty atrophy of the pancreas also seen on June 2024 lumbar MRI where partially covered. No underlying ductal dilatation or masslike finding. Spleen: Unremarkable. Adrenals/Urinary Tract: Negative adrenals. No hydronephrosis or stone. Indistinct margination of the urinary bladder, no underlying wall thickening or over distention. Stomach/Bowel: Dilated appendix to 14 mm with mesoappendiceal fat stranding. The appendix is retrocecal. No perforation or abscess. Tiny appendicolith best seen on sagittal reformats. Vascular/Lymphatic: No acute vascular abnormality. Atheromatous calcification of the aorta and iliacs no mass or adenopathy. Reproductive:No pathologic findings. Other: No ascites or pneumoperitoneum. Musculoskeletal: No acute  abnormalities. Lumbar spine degeneration with L4-5 and L5-S1 fusion. IMPRESSION: 1. Acute appendicitis.  The appendix is retrocecal. 2. Cholelithiasis and atherosclerosis. Electronically Signed   By: Tiburcio Pea M.D.   On: 02/26/2024 07:11     Assessment & Plan:  Cindy Mcknight is a 68 y.o. female who presented with a 12-hour history of right-sided lower abdominal pain.  Imaging demonstrates acute appendicitis.  Imaging and blood work evaluated by myself.  -We discussed the pathophysiology of appendicitis -Discussed the risk of laparoscopic appendectomy and the option of antibiotics alone. Discussed that in [place] and some trials in the [place], antibiotics are used for simple appendicitis. Discussed that research shows a 40% failure rate for antibiotics alone.  Discussed risk of surgery including but not limited to bleeding, infection, injury to other organs, normal appendix, need for possible colectomy, risk of postoperative intra-abdominal abscess development, and after this discussion the patient has decided to proceed with surgery. -Plan for surgery today -NPO -IV Zosyn running in the emergency department -IV fluids -PRN pain control and antiemetics -Further recommendations to follow surgery.  If patient's pain is well-controlled with oral medications, and she is able to tolerate a diet after surgery, she will potentially be discharged home later today versus tomorrow  All questions were answered to the satisfaction of the patient and family.  Note: Portions of this report may have been transcribed using voice recognition software. Every effort has been made to ensure accuracy; however, inadvertent computerized transcription errors may still be present.   Theophilus Kinds, DO [facility] [address] [place], [place] [address] [phone] (office)

## 2024-02-26 NOTE — Care Management CC44 (Signed)
 Condition Code 44 Documentation Completed  Patient Details  Name: Cindy Mcknight MRN: [id number] Date of Birth: 19-Oct-1956   Condition Code 44 given:  Yes Patient signature on Condition Code 44 notice:  Yes Documentation of 2 MD's agreement:  Yes Code 44 added to claim:  Yes    Leitha Bleak, RN 02/26/2024, 12:19 PM

## 2024-02-26 NOTE — ED Notes (Signed)
 Patient transported to CT

## 2024-02-26 NOTE — Progress Notes (Signed)
 [facility]  Spoke with the patient's husband in the consultation room.  I explained that she tolerated the procedure without difficulty.  She has dissolvable stitches under the skin with overlying skin glue.  This will flake off in 10 to 14 days.  She will go to a room on the floor.  If she is able to tolerate a diet without nausea and vomiting and pain is controlled with oral pain medications, then she will be stable for discharge home today.  Otherwise, plan will be to discharge the patient home tomorrow.  She will be discharged home with a prescription for narcotic pain medication that they should take as needed for pain.  I also want her taking scheduled Tylenol.  If they take the narcotic pain medication, they should take a stool softener as well.  The patient will follow-up with me in 2 weeks for phone follow-up.  All questions were answered to his expressed satisfaction.  Plan: -Patient admitted under observation -Regular diet -PRN pain control and antiemetics -1 dose of Zosyn postoperatively -Possible discharge home later today vs tomorrow  Theophilus Kinds, DO [facility] [address] [place], [place] [address] [phone] (office)

## 2024-02-26 NOTE — Transfer of Care (Signed)
 Immediate Anesthesia Transfer of Care Note  Patient: Cindy Mcknight  Procedure(s) Performed: APPENDECTOMY, ROBOT-ASSISTED, LAPAROSCOPIC; LYSIS OF ADHESIONS. (Abdomen)  Patient Location: PACU  Anesthesia Type:General  Level of Consciousness: awake, alert , and oriented  Airway & Oxygen Therapy: Patient Spontanous Breathing and Patient connected to face mask oxygen  Post-op Assessment: Report given to RN and Post -op Vital signs reviewed and stable  Post vital signs: Reviewed and stable  Last Vitals:  Vitals Value Taken Time  BP 125/48 02/26/24 1145  Temp 97.8   Pulse 88 02/26/24 1145  Resp 13 02/26/24 1145  SpO2 100 % 02/26/24 1145  Vitals shown include unfiled device data.  Last Pain:  Vitals:   02/26/24 0832  TempSrc: Oral  PainSc: 0-No pain         Complications: No notable events documented.

## 2024-02-26 NOTE — Discharge Instructions (Signed)
Ambulatory Surgery Discharge Instructions  General Anesthesia or Sedation Do not drive or operate heavy machinery for 24 hours.  Do not consume alcohol, tranquilizers, sleeping medications, or any non-prescribed medications for 24 hours. Do not make important decisions or sign any important papers in the next 24 hours. You should have someone with you tonight at home.  Activity  You are advised to go directly home from the hospital.  Restrict your activities and rest for a day.  Resume light activity tomorrow. No heavy lifting over 10 lbs or strenuous exercise.  Fluids and Diet Regular diet  Medications  If you have not had a bowel movement in 24 hours, take 2 tablespoons over the counter Milk of mag.             You May resume your blood thinners tomorrow (Aspirin, coumadin, or other).  You are being discharged with prescriptions for Opioid/Narcotic Medications: There are some specific considerations for these medications that you should know. Opioid Meds have risks & benefits. Addiction to these meds is always a concern with prolonged use Take medication only as directed Do not drive while taking narcotic pain medication Do not crush tablets or capsules Do not use a different container than medication was dispensed in Lock the container of medication in a cool, dry place out of reach of children and pets. Opioid medication can cause addiction Do not share with anyone else (this is a felony) Do not store medications for future use. Dispose of them properly.     Disposal:  Find a [facility] household drug take back site near you.  If you can't get to a drug take back site, use the recipe below as a last resort to dispose of expired, unused or unwanted drugs. Disposal  (Do not dispose chemotherapy drugs this way, talk to your prescribing doctor instead.) Step 1: Mix drugs (do not crush) with dirt, kitty litter, or used coffee grounds and add a small amount of water to dissolve any  solid medications. Step 2: Seal drugs in plastic bag. Step 3: Place plastic bag in trash. Step 4: Take prescription container and scratch out personal information, then recycle or throw away.  Operative Site  You have a liquid bandage over your incisions, this will begin to flake off in about a week. Ok to shower tomorrow. Keep wound clean and dry. No baths or swimming. No lifting more than 10 pounds.  Contact Information: If you have questions or concerns, please call our office, [phone], Monday- Thursday 8AM-5PM and Friday 8AM-12Noon.  If it is after hours or on the weekend, please call [facility] Main Number, [phone], and ask to speak to the surgeon on call for Dr. Brittnee Gaetano at [place].   SPECIFIC COMPLICATIONS TO WATCH FOR: Inability to urinate Fever over 101? F by mouth Nausea and vomiting lasting longer than 24 hours. Pain not relieved by medication ordered Swelling around the operative site Increased redness, warmth, hardness, around operative area Numbness, tingling, or cold fingers or toes Blood -soaked dressing, (small amounts of oozing may be normal) Increasing and progressive drainage from surgical area or exam site  

## 2024-02-26 NOTE — ED Provider Notes (Signed)
  Physical Exam  BP 115/71   Pulse 85   Temp 98.6 F (37 C) (Oral)   Resp 18   Ht 5\' 6"  (1.676 m)   Wt 95.3 kg   SpO2 97%   BMI 33.89 kg/m   Physical Exam  Procedures  Procedures  ED Course / MDM   Clinical Course as of 02/26/24 0742  Thu Feb 26, 2024  0658 Assumed care from Dr. Wilkie Aye.  68 yo F  who presented with recent colonoscopy then last night started having lower abdominal pain. Feels sore. Didn't feel this way immediately after the colonoscopy. Didn't do any procedural interventions. Will need CT read and UA.  [name]  1610 CT scan with acute uncomplicated appendicitis.  Patient started on Zosyn.  Consulted general surgery.  Dr Robyne Peers to admit. Will likely perform appendectomy later today.  [name]    Clinical Course User Index [name] Rondel Baton, MD   Medical Decision Making Amount and/or Complexity of Data Reviewed Labs: ordered. Radiology: ordered.  Risk Prescription drug management. Decision regarding hospitalization.      Rondel Baton, MD 02/26/24 [phone]

## 2024-02-26 NOTE — ED Provider Notes (Signed)
  EMERGENCY DEPARTMENT AT [facility] Provider Note   CSN: [id number] Arrival date & time: 02/26/24  0534     History  Chief Complaint  Patient presents with   Abdominal Pain    Cindy Mcknight is a 68 y.o. female.  HPI     This is a 68 year old female who presents with lower abdominal pain.  Onset of pain last night around 8 PM.  It is over the lower abdomen.  She states that it is dull and "sore.  She took 2 Pepto-Bismol with minimal relief.  She has had some nausea and dry heaving but no vomiting.  Reports normal formed stools yesterday.  She did have a colonoscopy on Friday but did not have any interventions.  She is not noted any blood in her stools.  Denies urinary symptoms or fevers.  Home Medications Prior to Admission medications   Medication Sig Start Date End Date Taking? Authorizing Provider  acetaminophen (TYLENOL) 500 MG tablet Take 500 mg by mouth every 6 (six) hours as needed.    [provider]  Calcium Carb-Cholecalciferol (CALCIUM 600+D3 PO) Take 1 tablet by mouth daily.    [provider]  Cholecalciferol (VITAMIN D3) 50 MCG (2000 UT) CAPS Take 2,000 Units by mouth daily.    [provider]  lisinopril (PRINIVIL,ZESTRIL) 20 MG tablet Take 20 mg by mouth daily.    [provider]  metFORMIN (GLUCOPHAGE-XR) 500 MG 24 hr tablet Take 500 mg by mouth 2 (two) times daily with a meal.    [provider]  OZEMPIC, 1 MG/DOSE, 4 MG/3ML SOPN Inject 1 mg into the skin once a week.    [provider]  Sodium Sulfate-Mag Sulfate-KCl (SUTAB) [phone] MG TABS As directed 01/23/24   Rourk, Gerrit Friends, MD      Allergies    Patient has no known allergies.    Review of Systems   Review of Systems  Constitutional:  Negative for fever.  Gastrointestinal:  Positive for abdominal pain and nausea. Negative for blood in stool, constipation, diarrhea and vomiting.  All other systems reviewed and are  negative.   Physical Exam Updated Vital Signs BP 136/70 (BP Location: Right Arm)   Pulse (!) 107   Temp 98.6 F (37 C) (Oral)   Resp 18   Ht 1.676 m (5\' 6" )   Wt 95.3 kg   SpO2 97%   BMI 33.89 kg/m  Physical Exam Vitals and nursing note reviewed.  Constitutional:      Appearance: She is well-developed. She is obese. She is not ill-appearing.  HENT:     Head: Normocephalic and atraumatic.  Eyes:     Pupils: Pupils are equal, round, and reactive to light.  Cardiovascular:     Rate and Rhythm: Normal rate and regular rhythm.     Heart sounds: Normal heart sounds.  Pulmonary:     Effort: Pulmonary effort is normal. No respiratory distress.     Breath sounds: No wheezing.  Abdominal:     General: Bowel sounds are normal.     Palpations: Abdomen is soft.     Tenderness: There is abdominal tenderness in the right lower quadrant, suprapubic area and left lower quadrant. There is no guarding or rebound.  Musculoskeletal:     Cervical back: Neck supple.  Skin:    General: Skin is warm and dry.  Neurological:     General: No focal deficit present.     Mental Status: She is alert  and oriented to person, place, and time.     ED Results / Procedures / Treatments   Labs (all labs ordered are listed, but only abnormal results are displayed) Labs Reviewed  CBC WITH DIFFERENTIAL/PLATELET  COMPREHENSIVE METABOLIC PANEL  LIPASE, BLOOD  URINALYSIS, ROUTINE W REFLEX MICROSCOPIC    EKG None  Radiology No results found.  Procedures Procedures    Medications Ordered in ED Medications  morphine (PF) 4 MG/ML injection 4 mg (has no administration in time range)  ondansetron (ZOFRAN) injection 4 mg (has no administration in time range)    ED Course/ Medical Decision Making/ A&P Clinical Course as of 02/26/24 2303  Thu Feb 26, 2024  0658 Assumed care from Dr. Wilkie Aye.  68 yo F  who presented with recent colonoscopy then last night started having lower abdominal pain. Feels  sore. Didn't feel this way immediately after the colonoscopy. Didn't do any procedural interventions. Will need CT read and UA.  [name]  3086 CT scan with acute uncomplicated appendicitis.  Patient started on Zosyn.  Consulted general surgery.  Dr Robyne Peers to admit. Will likely perform appendectomy later today.  [name]    Clinical Course User Index [name] Rondel Baton, MD                                 Medical Decision Making Amount and/or Complexity of Data Reviewed Labs: ordered. Radiology: ordered.  Risk Prescription drug management. Decision regarding hospitalization.   This patient presents to the ED for concern of  abdominal pain, this involves an extensive number of treatment options, and is a complaint that carries with it a high risk of complications and morbidity.  I considered the following differential and admission for this acute, potentially life threatening condition.  The differential diagnosis includes constipation, appendicitis, UTI, perforation from recent instrumentation  MDM:    This is a 68 year old female who presents with nondescript lower abdominal "soreness".  She is nontoxic-appearing.  Vital signs are reassuring.  She has minimal diffuse lower abdominal tenderness palpation, no point tenderness.  She is afebrile.  She has some nausea without vomiting.  Labs obtained.  Labs show mild leukocytosis.  Otherwise unremarkable.  Given recent instrumentation and leukocytosis, will obtain CT imaging.  (Labs, imaging, consults)  Labs: I Ordered, and personally interpreted labs.  The pertinent results include: CBC, CMP, urinalysis, lipase  Imaging Studies ordered: I ordered imaging studies including CT pending I independently visualized and interpreted imaging. I agree with the radiologist interpretation  Additional history obtained from chart review.  External records from outside source obtained and reviewed including prior valuations  Cardiac  Monitoring: The patient was maintained on a cardiac monitor.  If on the cardiac monitor, I personally viewed and interpreted the cardiac monitored which showed an underlying rhythm of: Sinus  Reevaluation: After the interventions noted above, I reevaluated the patient and found that they have :stayed the same  Social Determinants of Health:  lives independently  Disposition: Discharge  Co morbidities that complicate the patient evaluation  Past Medical History:  Diagnosis Date   Arthritis    Chronic renal insufficiency    Diabetes mellitus    Type 2   Fainting    on occasion when she see needles   Hypertension    Obesity    Syncope and collapse 12/22/2015     Medicines Meds ordered this encounter  Medications   morphine (PF) 4 MG/ML injection 4  mg   ondansetron (ZOFRAN) injection 4 mg   iohexol (OMNIPAQUE) 300 MG/ML solution 100 mL   piperacillin-tazobactam (ZOSYN) IVPB 3.375 g    Antibiotic Indication::   Intra-abdominal Infection   DISCONTD: HYDROmorphone (DILAUDID) injection 1 mg   HYDROmorphone (DILAUDID) injection 0.5 mg   DISCONTD: Chlorhexidine Gluconate Cloth 2 % PADS 6 each   DISCONTD: Chlorhexidine Gluconate Cloth 2 % PADS 6 each   DISCONTD: enoxaparin (LOVENOX) injection 40 mg   DISCONTD: acetaminophen (TYLENOL) tablet 1,000 mg   DISCONTD: oxyCODONE (Oxy IR/ROXICODONE) immediate release tablet 5-10 mg    Refill:  0   DISCONTD: ondansetron (ZOFRAN-ODT) disintegrating tablet 4 mg   DISCONTD: ondansetron (ZOFRAN) injection 4 mg   DISCONTD: lactated ringers infusion   DISCONTD: chlorhexidine (PERIDEX) 0.12 % solution 15 mL   DISCONTD: Oral care mouth rinse   OR Linked Order Group    oxyCODONE (Oxy IR/ROXICODONE) immediate release tablet 5 mg    oxyCODONE (ROXICODONE) 5 MG/5ML solution 5 mg   DISCONTD: fentaNYL (SUBLIMAZE) injection 25-50 mcg   DISCONTD: ondansetron (ZOFRAN) injection 4 mg   scopolamine (TRANSDERM-SCOP) 1 MG/3DAYS    Teresita Madura F: cabinet  override   DISCONTD: sterile water for irrigation   DISCONTD: bupivacaine(PF) (MARCAINE) 0.5 % injection   acetaminophen (TYLENOL) 500 MG tablet    Sig: Take 2 tablets (1,000 mg total) by mouth every 6 (six) hours for 7 days.    Dispense:  56 tablet    Refill:  0   docusate sodium (COLACE) 100 MG capsule    Sig: Take 1 capsule (100 mg total) by mouth 2 (two) times daily.    Dispense:  60 capsule    Refill:  2   oxyCODONE (ROXICODONE) 5 MG immediate release tablet    Sig: Take 1 tablet (5 mg total) by mouth every 6 (six) hours as needed.    Dispense:  8 tablet    Refill:  0   ondansetron (ZOFRAN) 4 MG tablet    Sig: Take 1 tablet (4 mg total) by mouth daily as needed for nausea or vomiting.    Dispense:  30 tablet    Refill:  1   piperacillin-tazobactam (ZOSYN) IVPB 3.375 g    Antibiotic Indication::   Intra-abdominal Infection    I have reviewed the patients home medicines and have made adjustments as needed  Problem List / ED Course: Problem List Items Addressed This Visit       Digestive   * (Principal) Appendicitis - Primary                Final Clinical Impression(s) / ED Diagnoses Final diagnoses:  None    Rx / DC Orders ED Discharge Orders     None         Shon Baton, MD 02/26/24 2305

## 2024-02-26 NOTE — ED Triage Notes (Signed)
 Pt c/o lower abdominal pain starting out of no where last night around 2000. Pt states felt like someone was sitting on her now "just sore." Pt states she took 2 Pepto bismol's around 2200 with no relief. Pt states she has not ate anything since lunch. Pt states dry heaves once with no vomiting. Pt states a colonoscopy on Friday but nothing out of the ordinary. Pt denies being around anyone sick she knows of and able to urinate and have bowl movements with no pain.

## 2024-02-26 NOTE — Plan of Care (Signed)
  Problem: Education: Goal: Knowledge of General Education information will improve Description: Including pain rating scale, medication(s)/side effects and non-pharmacologic comfort measures 02/26/2024 1610 by Sheela Stack, RN Outcome: Adequate for Discharge 02/26/2024 1609 by Sheela Stack, RN Outcome: Adequate for Discharge   Problem: Health Behavior/Discharge Planning: Goal: Ability to manage health-related needs will improve 02/26/2024 1610 by Sheela Stack, RN Outcome: Adequate for Discharge 02/26/2024 1609 by Sheela Stack, RN Outcome: Adequate for Discharge   Problem: Clinical Measurements: Goal: Ability to maintain clinical measurements within normal limits will improve 02/26/2024 1610 by Sheela Stack, RN Outcome: Adequate for Discharge 02/26/2024 1609 by Sheela Stack, RN Outcome: Adequate for Discharge Goal: Will remain free from infection 02/26/2024 1610 by Sheela Stack, RN Outcome: Adequate for Discharge 02/26/2024 1609 by Sheela Stack, RN Outcome: Adequate for Discharge Goal: Diagnostic test results will improve 02/26/2024 1610 by Sheela Stack, RN Outcome: Adequate for Discharge 02/26/2024 1609 by Sheela Stack, RN Outcome: Adequate for Discharge Goal: Respiratory complications will improve 02/26/2024 1610 by Sheela Stack, RN Outcome: Adequate for Discharge 02/26/2024 1609 by Sheela Stack, RN Outcome: Adequate for Discharge Goal: Cardiovascular complication will be avoided 02/26/2024 1610 by Sheela Stack, RN Outcome: Adequate for Discharge 02/26/2024 1609 by Sheela Stack, RN Outcome: Adequate for Discharge   Problem: Activity: Goal: Risk for activity intolerance will decrease 02/26/2024 1610 by Sheela Stack, RN Outcome: Adequate for Discharge 02/26/2024 1609 by Sheela Stack, RN Outcome: Adequate for Discharge   Problem: Nutrition: Goal: Adequate nutrition will be maintained 02/26/2024 1610 by Sheela Stack, RN Outcome:  Adequate for Discharge 02/26/2024 1609 by Sheela Stack, RN Outcome: Adequate for Discharge   Problem: Coping: Goal: Level of anxiety will decrease 02/26/2024 1610 by Sheela Stack, RN Outcome: Adequate for Discharge 02/26/2024 1609 by Sheela Stack, RN Outcome: Adequate for Discharge   Problem: Elimination: Goal: Will not experience complications related to bowel motility 02/26/2024 1610 by Sheela Stack, RN Outcome: Adequate for Discharge 02/26/2024 1609 by Sheela Stack, RN Outcome: Adequate for Discharge Goal: Will not experience complications related to urinary retention 02/26/2024 1610 by Sheela Stack, RN Outcome: Adequate for Discharge 02/26/2024 1609 by Sheela Stack, RN Outcome: Adequate for Discharge   Problem: Pain Managment: Goal: General experience of comfort will improve and/or be controlled 02/26/2024 1610 by Sheela Stack, RN Outcome: Adequate for Discharge 02/26/2024 1609 by Sheela Stack, RN Outcome: Adequate for Discharge   Problem: Safety: Goal: Ability to remain free from injury will improve 02/26/2024 1610 by Sheela Stack, RN Outcome: Adequate for Discharge 02/26/2024 1609 by Sheela Stack, RN Outcome: Adequate for Discharge   Problem: Skin Integrity: Goal: Risk for impaired skin integrity will decrease 02/26/2024 1610 by Sheela Stack, RN Outcome: Adequate for Discharge 02/26/2024 1609 by Sheela Stack, RN Outcome: Adequate for Discharge

## 2024-02-26 NOTE — Op Note (Addendum)
 [facility] Operative Note  Preoperative diagnosis: Acute appendicitis  Postoperative diagnosis: acute appendicitis, intra-abdominal adhesions  Procedure: Robotic assisted laparoscopic appendectomy, lysis of adhesions  Anesthesia: General   Surgeon: Theophilus Kinds, DO  Wound Classification: Dirty/Infected  Specimen: Appendix  Complications: None  Estimated Blood Loss: 10 cc  Indications: Patient is a 68 y.o. female  presented with 12 hour history of lower abdominal pain.  She underwent a CT of the abdomen and pelvis which demonstrated acute appendicitis. The risk of surgery were explained to the patient including but not limited to bleeding, infection, finding a rupture, injury to other organs, needing to do an open procedure.   FIndings: Acute appendicitis with associated phlegmon Upon entering the abdomen (organ space), I encountered a phlegmon involving the appendix . Intra-abdominal adhesions to the anterior abdominal wall in the left lower quadrant  Description of procedure: The patient was placed on the operating table in the supine position, left arm tucked. General anesthesia was induced. A time-out was completed verifying correct patient, procedure, site, positioning, and implant(s) and/or special equipment prior to beginning this procedure. The abdomen was prepped and draped in the usual sterile fashion.  A time-out was completed verifying correct patient, procedure, site, positioning, and implant(s) and/or special equipment prior to beginning this procedure.  At Palmer's point, an incision was made and Veress needle was inserted.  After confirming intraabdominal location with positive saline drop test and low insufflation pressures, gas insufflation was initiated until the abdominal pressure was measured at 15 mmHg.  Afterwards, the Veress needle was removed and a 8 mm port was placed through the Palmer's point site using Optiview technique. No  injuries were noted.  Two additional incisions were made 8 cm apart along the left side of the abdominal wall from the initial incision.  8 mm ports were then placed under direct visualization.  The initial 8 mm port was then upsized to a 12 mm port.  No injuries from trocar placements were noted. The table flexed and was placed in the Trendelenburg position with the right side elevated.  Xi robotic platform was then brought to the operative field and docked. A vessel sealer was placed through the 12 mm port and a forced bipolar through the lower 8 mm port.   Upon entering the abdomen (organ space), I encountered a phlegmon involving the appendix . There were also adhesions noted to the anterior abdominal wall in the left lower quadrant.  These adhesions were taken down with vessel sealer.  An appendix that appeared inflamed and dilated and was identified and elevated.  Infection was present within the abdominal cavity due to appendicitis. Window created at base of appendix in the mesentery.  A white load linear cutting stapler was placed through the 12 mm port, and then used to divide and staple the base of the appendix. The vessel sealer was used to ligate the mesoappendix. No bleeding from the staple lines noted.  The appendix was placed in an endoscopic retrieval bag and removed through the 12 mm port.   The appendiceal staple line was examined again and hemostasis noted. No other pathology was identified within pelvis. The 12 mm trocar removed and port site closed with PMI using 0 vicryl under direct vision. Remaining trocars were removed. No bleeding was noted. The abdomen was allowed to collapse. All skin incisions then closed with subcuticular sutures Monocryl 4-0.  Wounds then dressed with dermabond.  The patient tolerated the procedure well, awakened from anesthesia and was taken  to the postanesthesia care unit in satisfactory condition.  Sponge count and instrument count correct at the end of the  procedure.  Theophilus Kinds, DO [facility] [address], [place] [address] [phone] (office)

## 2024-02-26 NOTE — Plan of Care (Signed)

## 2024-02-26 NOTE — Anesthesia Procedure Notes (Signed)
 Procedure Name: Intubation Date/Time: 02/26/2024 10:15 AM  Performed by: Julian Reil, CRNAPre-anesthesia Checklist: Patient identified, Emergency Drugs available, Suction available and Patient being monitored Patient Re-evaluated:Patient Re-evaluated prior to induction Oxygen Delivery Method: Circle system utilized Preoxygenation: Pre-oxygenation with 100% oxygen Induction Type: IV induction Ventilation: Mask ventilation without difficulty Laryngoscope Size: Glidescope and 3 Grade View: Grade I Tube type: Oral Tube size: 7.0 mm Number of attempts: 1 Airway Equipment and Method: Video-laryngoscopy and Rigid stylet Placement Confirmation: ETT inserted through vocal cords under direct vision, positive ETCO2 and breath sounds checked- equal and bilateral Secured at: 22 cm Tube secured with: Tape Dental Injury: Teeth and Oropharynx as per pre-operative assessment  Comments: Glidescope electively used due to many caps upper front, small mouth opening.

## 2024-02-26 NOTE — Care Management Obs Status (Signed)
 MEDICARE OBSERVATION STATUS NOTIFICATION   Patient Details  Name: Cindy Mcknight MRN: [id number] Date of Birth: 12-23-1955   Medicare Observation Status Notification Given:  Yes    Leitha Bleak, RN 02/26/2024, 12:19 PM

## 2024-02-27 ENCOUNTER — Encounter (HOSPITAL_COMMUNITY): Payer: Self-pay | Admitting: Surgery

## 2024-02-27 LAB — SURGICAL PATHOLOGY

## 2024-02-27 NOTE — Anesthesia Postprocedure Evaluation (Signed)
 Anesthesia Post Note  Patient: Cindy Mcknight  Procedure(s) Performed: APPENDECTOMY, ROBOT-ASSISTED, LAPAROSCOPIC; LYSIS OF ADHESIONS. (Abdomen)  Patient location during evaluation: Phase II Anesthesia Type: General Level of consciousness: awake Pain management: pain level controlled Vital Signs Assessment: post-procedure vital signs reviewed and stable Respiratory status: spontaneous breathing and respiratory function stable Cardiovascular status: blood pressure returned to baseline and stable Postop Assessment: no headache and no apparent nausea or vomiting Anesthetic complications: no Comments: Late entry   No notable events documented.   Last Vitals:  Vitals:   02/26/24 1245 02/26/24 1256  BP:  118/78  Pulse: 86 76  Resp: 17 18  Temp:  36.4 C  SpO2: 98% 98%    Last Pain:  Vitals:   02/26/24 1345  TempSrc:   PainSc: 5                  Windell Norfolk

## 2024-03-17 ENCOUNTER — Encounter: Payer: Self-pay | Admitting: Surgery

## 2024-03-17 ENCOUNTER — Ambulatory Visit (INDEPENDENT_AMBULATORY_CARE_PROVIDER_SITE_OTHER): Admitting: Surgery

## 2024-03-17 ENCOUNTER — Other Ambulatory Visit: Payer: Self-pay

## 2024-03-17 VITALS — BP 113/73 | HR 95 | Temp 98.0°F | Resp 16 | Ht 66.0 in | Wt 220.0 lb

## 2024-03-17 DIAGNOSIS — Z09 Encounter for follow-up examination after completed treatment for conditions other than malignant neoplasm: Secondary | ICD-10-CM

## 2024-03-17 NOTE — Progress Notes (Unsigned)
 [facility] Note   HPI:  68 y.o. Female presents to clinic for post-op follow-up status post robotic assisted laparoscopic appendectomy and lysis of adhesions on 3/13 for acute appendicitis.  Postoperatively, she did have a fair amount of pain at her left sided incision sites for a week or 2 after surgery.  Since that time now, the pain has mostly subsided.  She now complains of some musculoskeletal pain more lateral on the left side of her abdomen and into her left flank.  She denies any specific activities that resulted in this pain.  She is tolerating a diet without nausea and vomiting, moving her bowels without issue.  She denies fevers and chills.  Review of Systems:  All other review of systems: otherwise negative   Vital Signs:  BP 113/73   Pulse 95   Temp 98 F (36.7 C) (Oral)   Resp 16   Ht 5\' 6"  (1.676 m)   Wt 220 lb (99.8 kg)   SpO2 96%   BMI 35.51 kg/m    Physical Exam:  Physical Exam Vitals reviewed.  Constitutional:      Appearance: Normal appearance.  Abdominal:     Comments: Abdomen soft, nondistended, no percussion tenderness, nontender to palpation; no rigidity, guarding, rebound tenderness; well-healed laparoscopic incision sites; more lateral left abdomen nontender to palpation  Neurological:     Mental Status: She is alert.     Laboratory studies: None  Imaging:  None  Pathology: A. APPENDIX, APPENDECTOMY:  Acute appendicitis.   Assessment:  68 y.o. yo Female who presents for follow-up status post robotic assisted laparoscopic appendectomy and lysis of adhesions on 3/13.  Plan:  -Patient overall doing well since the surgery.  She is tolerating a diet, moving her bowels, and has no surgical site pain -Advised that she should try using ice packs and over-the-counter Tylenol for the pain in the left lateral aspect of her abdomen.  We discussed that it is unlikely that this pain is associated with her surgery, as we did not work in  that area, and it is farther lateral than her incision sites. -I will call the patient in 3 weeks to see if this pain has subsided.  If not, we will recommend that the patient follow-up with her PCP for further workup  All of the above recommendations were discussed with the patient, and all of patient's questions were answered to her expressed satisfaction.  Note: Portions of this report may have been transcribed using voice recognition software. Every effort has been made to ensure accuracy; however, inadvertent computerized transcription errors may still be present.   Theophilus Kinds, DO [facility] [address] [place] [place], [place] [address] [phone] (office)

## 2024-04-06 DIAGNOSIS — M5416 Radiculopathy, lumbar region: Secondary | ICD-10-CM | POA: Diagnosis not present

## 2024-04-06 DIAGNOSIS — Z6835 Body mass index (BMI) 35.0-35.9, adult: Secondary | ICD-10-CM | POA: Diagnosis not present

## 2024-04-06 DIAGNOSIS — M4316 Spondylolisthesis, lumbar region: Secondary | ICD-10-CM | POA: Diagnosis not present

## 2024-04-07 ENCOUNTER — Encounter: Admitting: Surgery

## 2024-04-15 DIAGNOSIS — E1129 Type 2 diabetes mellitus with other diabetic kidney complication: Secondary | ICD-10-CM | POA: Diagnosis not present

## 2024-04-15 DIAGNOSIS — I10 Essential (primary) hypertension: Secondary | ICD-10-CM | POA: Diagnosis not present

## 2024-04-15 DIAGNOSIS — Z13220 Encounter for screening for lipoid disorders: Secondary | ICD-10-CM | POA: Diagnosis not present

## 2024-04-15 DIAGNOSIS — N1832 Chronic kidney disease, stage 3b: Secondary | ICD-10-CM | POA: Diagnosis not present

## 2024-04-15 DIAGNOSIS — R5383 Other fatigue: Secondary | ICD-10-CM | POA: Diagnosis not present

## 2024-04-19 DIAGNOSIS — E1122 Type 2 diabetes mellitus with diabetic chronic kidney disease: Secondary | ICD-10-CM | POA: Diagnosis not present

## 2024-04-19 DIAGNOSIS — Z6834 Body mass index (BMI) 34.0-34.9, adult: Secondary | ICD-10-CM | POA: Diagnosis not present

## 2024-04-19 DIAGNOSIS — I10 Essential (primary) hypertension: Secondary | ICD-10-CM | POA: Diagnosis not present

## 2024-04-19 DIAGNOSIS — M79671 Pain in right foot: Secondary | ICD-10-CM | POA: Diagnosis not present

## 2024-04-19 DIAGNOSIS — M79672 Pain in left foot: Secondary | ICD-10-CM | POA: Diagnosis not present

## 2024-05-12 DIAGNOSIS — M19072 Primary osteoarthritis, left ankle and foot: Secondary | ICD-10-CM | POA: Diagnosis not present

## 2024-05-12 DIAGNOSIS — M19071 Primary osteoarthritis, right ankle and foot: Secondary | ICD-10-CM | POA: Diagnosis not present

## 2024-06-03 DIAGNOSIS — G7249 Other inflammatory and immune myopathies, not elsewhere classified: Secondary | ICD-10-CM | POA: Diagnosis not present

## 2024-07-06 DIAGNOSIS — M4316 Spondylolisthesis, lumbar region: Secondary | ICD-10-CM | POA: Diagnosis not present

## 2024-08-25 DIAGNOSIS — R7989 Other specified abnormal findings of blood chemistry: Secondary | ICD-10-CM | POA: Diagnosis not present

## 2024-08-25 DIAGNOSIS — I10 Essential (primary) hypertension: Secondary | ICD-10-CM | POA: Diagnosis not present

## 2024-08-25 DIAGNOSIS — E1122 Type 2 diabetes mellitus with diabetic chronic kidney disease: Secondary | ICD-10-CM | POA: Diagnosis not present

## 2024-08-25 DIAGNOSIS — N1832 Chronic kidney disease, stage 3b: Secondary | ICD-10-CM | POA: Diagnosis not present

## 2024-08-25 DIAGNOSIS — R5383 Other fatigue: Secondary | ICD-10-CM | POA: Diagnosis not present

## 2024-08-25 DIAGNOSIS — E1129 Type 2 diabetes mellitus with other diabetic kidney complication: Secondary | ICD-10-CM | POA: Diagnosis not present

## 2024-08-25 DIAGNOSIS — E785 Hyperlipidemia, unspecified: Secondary | ICD-10-CM | POA: Diagnosis not present

## 2024-08-30 DIAGNOSIS — Z6836 Body mass index (BMI) 36.0-36.9, adult: Secondary | ICD-10-CM | POA: Diagnosis not present

## 2024-08-30 DIAGNOSIS — R768 Other specified abnormal immunological findings in serum: Secondary | ICD-10-CM | POA: Diagnosis not present

## 2024-08-30 DIAGNOSIS — E669 Obesity, unspecified: Secondary | ICD-10-CM | POA: Diagnosis not present

## 2024-08-30 DIAGNOSIS — M25572 Pain in left ankle and joints of left foot: Secondary | ICD-10-CM | POA: Diagnosis not present

## 2024-08-30 DIAGNOSIS — M2559 Pain in other specified joint: Secondary | ICD-10-CM | POA: Diagnosis not present

## 2024-09-01 ENCOUNTER — Other Ambulatory Visit (HOSPITAL_COMMUNITY): Payer: Self-pay | Admitting: Internal Medicine

## 2024-09-01 DIAGNOSIS — M1990 Unspecified osteoarthritis, unspecified site: Secondary | ICD-10-CM

## 2024-09-01 DIAGNOSIS — E7800 Pure hypercholesterolemia, unspecified: Secondary | ICD-10-CM | POA: Diagnosis not present

## 2024-09-01 DIAGNOSIS — Z23 Encounter for immunization: Secondary | ICD-10-CM | POA: Diagnosis not present

## 2024-09-01 DIAGNOSIS — R252 Cramp and spasm: Secondary | ICD-10-CM | POA: Diagnosis not present

## 2024-09-01 DIAGNOSIS — E7801 Familial hypercholesterolemia: Secondary | ICD-10-CM | POA: Diagnosis not present

## 2024-09-01 DIAGNOSIS — I10 Essential (primary) hypertension: Secondary | ICD-10-CM | POA: Diagnosis not present

## 2024-09-01 DIAGNOSIS — Z6836 Body mass index (BMI) 36.0-36.9, adult: Secondary | ICD-10-CM | POA: Diagnosis not present

## 2024-09-01 DIAGNOSIS — E1122 Type 2 diabetes mellitus with diabetic chronic kidney disease: Secondary | ICD-10-CM | POA: Diagnosis not present

## 2024-09-08 ENCOUNTER — Encounter (HOSPITAL_COMMUNITY): Payer: Self-pay

## 2024-09-08 ENCOUNTER — Ambulatory Visit (HOSPITAL_COMMUNITY)

## 2024-09-14 ENCOUNTER — Ambulatory Visit (HOSPITAL_COMMUNITY)
Admission: RE | Admit: 2024-09-14 | Discharge: 2024-09-14 | Disposition: A | Discharge status: Home / Self Care | Source: Ambulatory Visit | Attending: Internal Medicine | Admitting: Internal Medicine

## 2024-09-14 DIAGNOSIS — M7731 Calcaneal spur, right foot: Secondary | ICD-10-CM | POA: Diagnosis not present

## 2024-09-14 DIAGNOSIS — M65971 Unspecified synovitis and tenosynovitis, right ankle and foot: Secondary | ICD-10-CM | POA: Diagnosis not present

## 2024-09-14 DIAGNOSIS — M7661 Achilles tendinitis, right leg: Secondary | ICD-10-CM | POA: Diagnosis not present

## 2024-09-14 DIAGNOSIS — M19071 Primary osteoarthritis, right ankle and foot: Secondary | ICD-10-CM | POA: Diagnosis not present

## 2024-09-14 DIAGNOSIS — M1990 Unspecified osteoarthritis, unspecified site: Secondary | ICD-10-CM | POA: Diagnosis not present

## 2024-09-14 DIAGNOSIS — M1380 Other specified arthritis, unspecified site: Secondary | ICD-10-CM

## 2024-10-11 DIAGNOSIS — M19071 Primary osteoarthritis, right ankle and foot: Secondary | ICD-10-CM | POA: Diagnosis not present

## 2024-10-11 DIAGNOSIS — M19072 Primary osteoarthritis, left ankle and foot: Secondary | ICD-10-CM | POA: Diagnosis not present

## 2024-10-27 DIAGNOSIS — H524 Presbyopia: Secondary | ICD-10-CM | POA: Diagnosis not present

## 2024-10-27 DIAGNOSIS — E119 Type 2 diabetes mellitus without complications: Secondary | ICD-10-CM | POA: Diagnosis not present

## 2024-10-27 DIAGNOSIS — Z961 Presence of intraocular lens: Secondary | ICD-10-CM | POA: Diagnosis not present

## 2024-11-04 ENCOUNTER — Other Ambulatory Visit (HOSPITAL_COMMUNITY): Payer: Self-pay | Admitting: Family Medicine

## 2024-11-04 DIAGNOSIS — Z1231 Encounter for screening mammogram for malignant neoplasm of breast: Secondary | ICD-10-CM

## 2024-11-26 ENCOUNTER — Ambulatory Visit (HOSPITAL_COMMUNITY)
Admission: RE | Admit: 2024-11-26 | Discharge: 2024-11-26 | Disposition: A | Source: Ambulatory Visit | Attending: Family Medicine | Admitting: Family Medicine

## 2024-11-26 ENCOUNTER — Encounter (HOSPITAL_COMMUNITY): Payer: Self-pay

## 2024-11-26 DIAGNOSIS — Z1231 Encounter for screening mammogram for malignant neoplasm of breast: Secondary | ICD-10-CM
